# Patient Record
Sex: Female | Born: 1945 | Race: Black or African American | Hispanic: No | Marital: Married | Smoking: Never smoker
Health system: Southern US, Community
[De-identification: ages and names within clinical notes are randomized; demographics above are authoritative.]

## PROBLEM LIST (undated history)

## (undated) DIAGNOSIS — E119 Type 2 diabetes mellitus without complications: Secondary | ICD-10-CM

## (undated) DIAGNOSIS — E785 Hyperlipidemia, unspecified: Secondary | ICD-10-CM

## (undated) DIAGNOSIS — N189 Chronic kidney disease, unspecified: Secondary | ICD-10-CM

## (undated) DIAGNOSIS — I10 Essential (primary) hypertension: Secondary | ICD-10-CM

## (undated) DIAGNOSIS — I214 Non-ST elevation (NSTEMI) myocardial infarction: Secondary | ICD-10-CM

## (undated) HISTORY — DX: Chronic kidney disease, unspecified: N18.9

---

## 2014-06-07 ENCOUNTER — Encounter: Payer: Self-pay | Admitting: Cardiology

## 2014-06-07 ENCOUNTER — Inpatient Hospital Stay (HOSPITAL_COMMUNITY)
Admission: EM | Admit: 2014-06-07 | Discharge: 2014-06-13 | DRG: 247 | Disposition: A | Discharge status: Home / Self Care | Payer: Medicare Other | Source: Other Acute Inpatient Hospital | Attending: Internal Medicine | Admitting: Internal Medicine

## 2014-06-07 ENCOUNTER — Encounter (HOSPITAL_COMMUNITY): Payer: Self-pay | Admitting: Internal Medicine

## 2014-06-07 DIAGNOSIS — I059 Rheumatic mitral valve disease, unspecified: Secondary | ICD-10-CM | POA: Diagnosis present

## 2014-06-07 DIAGNOSIS — N179 Acute kidney failure, unspecified: Secondary | ICD-10-CM | POA: Diagnosis present

## 2014-06-07 DIAGNOSIS — R7989 Other specified abnormal findings of blood chemistry: Secondary | ICD-10-CM

## 2014-06-07 DIAGNOSIS — E785 Hyperlipidemia, unspecified: Secondary | ICD-10-CM | POA: Diagnosis present

## 2014-06-07 DIAGNOSIS — Z833 Family history of diabetes mellitus: Secondary | ICD-10-CM | POA: Diagnosis not present

## 2014-06-07 DIAGNOSIS — Z8249 Family history of ischemic heart disease and other diseases of the circulatory system: Secondary | ICD-10-CM

## 2014-06-07 DIAGNOSIS — Z6831 Body mass index (BMI) 31.0-31.9, adult: Secondary | ICD-10-CM | POA: Diagnosis not present

## 2014-06-07 DIAGNOSIS — I214 Non-ST elevation (NSTEMI) myocardial infarction: Principal | ICD-10-CM

## 2014-06-07 DIAGNOSIS — E1129 Type 2 diabetes mellitus with other diabetic kidney complication: Secondary | ICD-10-CM

## 2014-06-07 DIAGNOSIS — N183 Chronic kidney disease, stage 3 unspecified: Secondary | ICD-10-CM | POA: Diagnosis present

## 2014-06-07 DIAGNOSIS — IMO0001 Reserved for inherently not codable concepts without codable children: Secondary | ICD-10-CM | POA: Diagnosis present

## 2014-06-07 DIAGNOSIS — E1165 Type 2 diabetes mellitus with hyperglycemia: Secondary | ICD-10-CM

## 2014-06-07 DIAGNOSIS — R0789 Other chest pain: Secondary | ICD-10-CM | POA: Diagnosis present

## 2014-06-07 DIAGNOSIS — I2510 Atherosclerotic heart disease of native coronary artery without angina pectoris: Secondary | ICD-10-CM | POA: Diagnosis present

## 2014-06-07 DIAGNOSIS — Z955 Presence of coronary angioplasty implant and graft: Secondary | ICD-10-CM

## 2014-06-07 DIAGNOSIS — K219 Gastro-esophageal reflux disease without esophagitis: Secondary | ICD-10-CM | POA: Diagnosis present

## 2014-06-07 DIAGNOSIS — N189 Chronic kidney disease, unspecified: Secondary | ICD-10-CM

## 2014-06-07 DIAGNOSIS — R079 Chest pain, unspecified: Secondary | ICD-10-CM

## 2014-06-07 DIAGNOSIS — I129 Hypertensive chronic kidney disease with stage 1 through stage 4 chronic kidney disease, or unspecified chronic kidney disease: Secondary | ICD-10-CM | POA: Diagnosis present

## 2014-06-07 DIAGNOSIS — R778 Other specified abnormalities of plasma proteins: Secondary | ICD-10-CM

## 2014-06-07 DIAGNOSIS — T50995A Adverse effect of other drugs, medicaments and biological substances, initial encounter: Secondary | ICD-10-CM | POA: Diagnosis not present

## 2014-06-07 DIAGNOSIS — D638 Anemia in other chronic diseases classified elsewhere: Secondary | ICD-10-CM | POA: Diagnosis present

## 2014-06-07 DIAGNOSIS — Z79899 Other long term (current) drug therapy: Secondary | ICD-10-CM

## 2014-06-07 DIAGNOSIS — E669 Obesity, unspecified: Secondary | ICD-10-CM | POA: Diagnosis present

## 2014-06-07 DIAGNOSIS — IMO0002 Reserved for concepts with insufficient information to code with codable children: Secondary | ICD-10-CM

## 2014-06-07 DIAGNOSIS — I10 Essential (primary) hypertension: Secondary | ICD-10-CM | POA: Diagnosis present

## 2014-06-07 HISTORY — DX: Type 2 diabetes mellitus without complications: E11.9

## 2014-06-07 HISTORY — DX: Non-ST elevation (NSTEMI) myocardial infarction: I21.4

## 2014-06-07 HISTORY — DX: Essential (primary) hypertension: I10

## 2014-06-07 HISTORY — DX: Hyperlipidemia, unspecified: E78.5

## 2014-06-07 LAB — GLUCOSE, CAPILLARY: GLUCOSE-CAPILLARY: 184 mg/dL — AB (ref 70–99)

## 2014-06-07 MED ORDER — INSULIN DETEMIR 100 UNIT/ML ~~LOC~~ SOLN
10.0000 [IU] | Freq: Every day | SUBCUTANEOUS | Status: DC
  Administered 2014-06-08: 10 [IU] via SUBCUTANEOUS
  Filled 2014-06-07: qty 0.1

## 2014-06-07 MED ORDER — SODIUM CHLORIDE 0.9 % IV SOLN
INTRAVENOUS | Status: DC
  Administered 2014-06-08 (×2): via INTRAVENOUS

## 2014-06-07 MED ORDER — ASPIRIN EC 325 MG PO TBEC
325.0000 mg | DELAYED_RELEASE_TABLET | Freq: Every day | ORAL | Status: DC
  Administered 2014-06-08 – 2014-06-10 (×3): 325 mg via ORAL
  Filled 2014-06-07 (×5): qty 1

## 2014-06-07 MED ORDER — PANTOPRAZOLE SODIUM 40 MG PO TBEC
40.0000 mg | DELAYED_RELEASE_TABLET | Freq: Every day | ORAL | Status: DC
  Administered 2014-06-08 – 2014-06-13 (×6): 40 mg via ORAL
  Filled 2014-06-07 (×6): qty 1

## 2014-06-07 MED ORDER — INSULIN ASPART 100 UNIT/ML ~~LOC~~ SOLN: 0.0000 [IU] | Freq: Three times a day (TID) | SUBCUTANEOUS | Status: DC

## 2014-06-07 MED ORDER — MORPHINE SULFATE 2 MG/ML IJ SOLN: 1.0000 mg | INTRAMUSCULAR | Status: DC | PRN

## 2014-06-07 MED ORDER — ONDANSETRON HCL 4 MG/2ML IJ SOLN: 4.0000 mg | Freq: Four times a day (QID) | INTRAMUSCULAR | Status: DC | PRN

## 2014-06-07 MED ORDER — OXYCODONE HCL 5 MG PO TABS
5.0000 mg | ORAL_TABLET | ORAL | Status: DC | PRN
  Administered 2014-06-11: 18:00:00 5 mg via ORAL
  Filled 2014-06-07: qty 1

## 2014-06-07 MED ORDER — HEPARIN SODIUM (PORCINE) 5000 UNIT/ML IJ SOLN
5000.0000 [IU] | Freq: Three times a day (TID) | INTRAMUSCULAR | Status: DC
  Administered 2014-06-08 – 2014-06-09 (×5): 5000 [IU] via SUBCUTANEOUS
  Filled 2014-06-07 (×9): qty 1

## 2014-06-07 MED ORDER — ONDANSETRON HCL 4 MG PO TABS: 4.0000 mg | ORAL_TABLET | Freq: Four times a day (QID) | ORAL | Status: DC | PRN

## 2014-06-07 MED ORDER — ASPIRIN EC 325 MG PO TBEC: 325.0000 mg | DELAYED_RELEASE_TABLET | Freq: Every day | ORAL | Status: DC

## 2014-06-07 MED ORDER — SODIUM CHLORIDE 0.9 % IJ SOLN
3.0000 mL | Freq: Two times a day (BID) | INTRAMUSCULAR | Status: DC
  Administered 2014-06-08 – 2014-06-13 (×5): 3 mL via INTRAVENOUS

## 2014-06-07 MED ORDER — ATORVASTATIN CALCIUM 40 MG PO TABS
40.0000 mg | ORAL_TABLET | Freq: Every day | ORAL | Status: DC
  Administered 2014-06-08 – 2014-06-09 (×2): 40 mg via ORAL
  Filled 2014-06-07 (×3): qty 1

## 2014-06-07 MED ORDER — ACETAMINOPHEN 325 MG PO TABS
650.0000 mg | ORAL_TABLET | Freq: Four times a day (QID) | ORAL | Status: DC | PRN
  Administered 2014-06-09: 650 mg via ORAL
  Filled 2014-06-07: qty 2

## 2014-06-07 MED ORDER — ACETAMINOPHEN 650 MG RE SUPP: 650.0000 mg | Freq: Four times a day (QID) | RECTAL | Status: DC | PRN

## 2014-06-07 MED ORDER — CARVEDILOL 12.5 MG PO TABS
12.5000 mg | ORAL_TABLET | Freq: Two times a day (BID) | ORAL | Status: DC
  Administered 2014-06-08 (×2): 12.5 mg via ORAL
  Filled 2014-06-07 (×5): qty 1

## 2014-06-07 NOTE — H&P (Signed)
[facility] History and Physical  Miche Loughridge ZOX:[id number] DOB: June 12, 1946 DOA: 06/07/2014  Referring physician: Dr. Francesca Oman PCP: No primary provider on file.   Chief Complaint: hyperglycemia, chest pressure, weakness/fatigue  HPI: Janice Love is a 68 y.o. female with pmh significant for DM type 2 diagnosed approx 3 months or so ago (on metformin), HTN, HLD and GERD; came from [facility] hospital due to 3-4 days of generalized weakness, polyuria and chest pressure; patient had mild elevated troponin acute renal failure and hyperglycemia (600 range). Patient reports chest pressure occurs mainly with activity/exertion, radiates to the base of her neck and last approx 20-30 minutes. Patient is currently CP free on transfer and the moment of examination. She denies palpitations, SOB or diaphoresis with pain. No EKG abnormalities appreciated at [facility]; patient transferred due to lack of cardiology coverage over the weekend. Patient denies nausea, vomiting, dysuria, diarrhea, fever, chills, cough, orthopnea, leg swelling or any other complaints.   Review of Systems:  Negative except as mentioned on HPI.  Past Medical History  Diagnosis Date  . Hypertension   .  Diabetes mellitus without complication GERD HLD    History reviewed. No pertinent past surgical history.  Social History:  reports that she has never smoked. She does not have any smokeless tobacco history on file. Her alcohol and drug histories are not on file.  Not on File  Family History: positive for HTN and cholesterol; otherwise non-contributory according to patient.  Prior to Admission medications   Not on File   Physical Exam: Filed Vitals:   06/07/14 2230  BP: 159/58  Pulse: 84  Temp: 98.3 F (36.8 C)  TempSrc: Oral  Resp: 18  Height: 5\' 3"  (1.6 m)  Weight: 72.802 kg (160 lb 8 oz)  SpO2: 100%    Wt Readings from Last 3 Encounters:  06/07/14 72.802 kg (160 lb 8 oz)    General:  Appears  calm and comfortable; afebrile. No distress and currently denying CP Eyes: PERRL, normal lids, irises & conjunctiva, no icterus, no nystagmus  ENT: grossly normal hearing, lips & tongue, no thrush, no erythema, no exudates; no drainage out of ears or nostrils Neck: no LAD, masses or thyromegaly, no JVD Cardiovascular: RRR, positive SEM, no rubs or gallops; no LE edema Telemetry: SR, no arrhythmias  Respiratory: CTA bilaterally, no w/r/r. Normal respiratory effort. Abdomen: soft, nt, nd; positive BS Skin: no rash, petechiae or induration seen on limited exam Musculoskeletal: grossly normal tone BUE/BLE; no cyanosis or clubbing appreciated on exam Psychiatric: grossly normal mood and affect, speech fluent and appropriate Neurologic: grossly non-focal.          Labs on Admission:  Basic Metabolic Panel: No results found for this basename: NA, K, CL, CO2, GLUCOSE, BUN, CREATININE, CALCIUM, MG, PHOS,  in the last 168 hours Liver Function Tests: No results found for this basename: AST, ALT, ALKPHOS, BILITOT, PROT, ALBUMIN,  in the last 168 hours No results found for this basename: LIPASE, AMYLASE,  in the last 168 hours No results found for this basename: AMMONIA,  in the last 168 hours CBC: No results found for this basename: WBC, NEUTROABS, HGB, HCT, MCV, PLT,  in the last 168 hours Cardiac Enzymes: No results found for this basename: CKTOTAL, CKMB, CKMBINDEX, TROPONINI,  in the last 168 hours  BNP (last 3 results) No results found for this basename: PROBNP,  in the last 8760 hours CBG:  Recent Labs Lab 06/07/14 2304  GLUCAP 184*    Radiological Exams  on Admission: No results found.  EKG:    Assessment/Plan 1-Chest pain: typical angina pain, heart score 4-5. -will admit to telemetry -cycle EKG, troponin and check 2-D echo -will start patient on ASA, coreg BID and lipitor -PRN morphine for pain and O2 supplementation as needed -cardiology called as patient will need at the  very least stress test for stratification; will follow recommendations.  2-Benign essential HTN: will use coreg BID and low sodium diet -will wait for pharmacy to finalize medication reconciliation to review and resume home meds as well  3-Acute renal failure: unclear if acute on chronic (presumably has CKD at least stage 1 or 2, given hx of HTN and DM type 2). Presentation this time due to over diuresed with hyperglycemia state. -will provide fluid resuscitation -check UA/cx -follow BMET function -base on clinical response will need renal US  4-Type II or unspecified type diabetes mellitus without mention of complication, uncontrolled:  -will check A1C  -started on levemir and SSI -holding home metformin due to renal failure and possible cardiac cath  5-Elevated troponin: midly elevation [facility] (0.11); patient currently CP free and no acute EKG abnormalities.  6-HLD (hyperlipidemia): will start patient on lipitor and check lipid profile  7-GERD: on PPI  Cardiology (they will see patient in am)  Code Status: Full DVT Prophylaxis:heparin Family Communication: no family at bedisde Disposition Plan: inpatient, telemetry; LOS > 2 midnights  Time spent: 50 minutes  Vassie LollMadera, Janice Love [facility] Pager [phone]  **Disclaimer: This note may have been dictated with voice recognition software. Similar sounding words can inadvertently be transcribed and this note may contain transcription errors which may not have been corrected upon publication of note.**

## 2014-06-07 NOTE — Progress Notes (Signed)
Patient Transferred [facility] with positive troponins, no aspirin or heparin given prior to arrival. Dr.Madera made aware, aspirin given, will continue to monitor patient.

## 2014-06-08 ENCOUNTER — Inpatient Hospital Stay (HOSPITAL_COMMUNITY): Payer: Medicare Other

## 2014-06-08 ENCOUNTER — Encounter (HOSPITAL_COMMUNITY): Payer: Self-pay | Admitting: Cardiology

## 2014-06-08 DIAGNOSIS — I059 Rheumatic mitral valve disease, unspecified: Secondary | ICD-10-CM

## 2014-06-08 LAB — PHOSPHORUS: Phosphorus: 3.5 mg/dL (ref 2.3–4.6)

## 2014-06-08 LAB — LIPID PANEL
Cholesterol: 228 mg/dL — ABNORMAL HIGH (ref 0–200)
HDL: 39 mg/dL — ABNORMAL LOW (ref >39)
LDL CALC: 147 mg/dL — AB (ref 0–99)
TRIGLYCERIDES: 211 mg/dL — AB (ref <150)
Total CHOL/HDL Ratio: 5.8 RATIO
VLDL: 42 mg/dL — ABNORMAL HIGH (ref 0–40)

## 2014-06-08 LAB — COMPREHENSIVE METABOLIC PANEL
ALBUMIN: 3.2 g/dL — AB (ref 3.5–5.2)
ALT: 14 U/L (ref 0–35)
AST: 15 U/L (ref 0–37)
Alkaline Phosphatase: 88 U/L (ref 39–117)
Anion gap: 14 (ref 5–15)
BILIRUBIN TOTAL: 0.3 mg/dL (ref 0.3–1.2)
BUN: 42 mg/dL — ABNORMAL HIGH (ref 6–23)
CHLORIDE: 100 meq/L (ref 96–112)
CO2: 23 mEq/L (ref 19–32)
Calcium: 9 mg/dL (ref 8.4–10.5)
Creatinine, Ser: 1.53 mg/dL — ABNORMAL HIGH (ref 0.50–1.10)
GFR calc Af Amer: 39 mL/min — ABNORMAL LOW (ref >90)
GFR calc non Af Amer: 34 mL/min — ABNORMAL LOW (ref >90)
Glucose, Bld: 369 mg/dL — ABNORMAL HIGH (ref 70–99)
Potassium: 4.6 mEq/L (ref 3.7–5.3)
Sodium: 137 mEq/L (ref 137–147)
Total Protein: 7.3 g/dL (ref 6.0–8.3)

## 2014-06-08 LAB — BASIC METABOLIC PANEL
ANION GAP: 12 (ref 5–15)
BUN: 39 mg/dL — ABNORMAL HIGH (ref 6–23)
CHLORIDE: 102 meq/L (ref 96–112)
CO2: 24 meq/L (ref 19–32)
Calcium: 9 mg/dL (ref 8.4–10.5)
Creatinine, Ser: 1.46 mg/dL — ABNORMAL HIGH (ref 0.50–1.10)
GFR calc Af Amer: 41 mL/min — ABNORMAL LOW (ref >90)
GFR calc non Af Amer: 36 mL/min — ABNORMAL LOW (ref >90)
Glucose, Bld: 294 mg/dL — ABNORMAL HIGH (ref 70–99)
Potassium: 4.4 mEq/L (ref 3.7–5.3)
Sodium: 138 mEq/L (ref 137–147)

## 2014-06-08 LAB — CBC WITH DIFFERENTIAL/PLATELET
Basophils Absolute: 0 10*3/uL (ref 0.0–0.1)
Basophils Relative: 0 % (ref 0–1)
Eosinophils Absolute: 0.1 10*3/uL (ref 0.0–0.7)
Eosinophils Relative: 1 % (ref 0–5)
HCT: 29.6 % — ABNORMAL LOW (ref 36.0–46.0)
HEMOGLOBIN: 10.1 g/dL — AB (ref 12.0–15.0)
LYMPHS ABS: 2.4 10*3/uL (ref 0.7–4.0)
LYMPHS PCT: 38 % (ref 12–46)
MCH: 28.8 pg (ref 26.0–34.0)
MCHC: 34.1 g/dL (ref 30.0–36.0)
MCV: 84.3 fL (ref 78.0–100.0)
MONOS PCT: 7 % (ref 3–12)
Monocytes Absolute: 0.4 10*3/uL (ref 0.1–1.0)
NEUTROS ABS: 3.5 10*3/uL (ref 1.7–7.7)
NEUTROS PCT: 54 % (ref 43–77)
Platelets: 210 10*3/uL (ref 150–400)
RBC: 3.51 MIL/uL — AB (ref 3.87–5.11)
RDW: 12.7 % (ref 11.5–15.5)
WBC: 6.4 10*3/uL (ref 4.0–10.5)

## 2014-06-08 LAB — HEMOGLOBIN A1C
Hgb A1c MFr Bld: 15.2 % — ABNORMAL HIGH (ref <5.7)
Mean Plasma Glucose: 390 mg/dL — ABNORMAL HIGH (ref <117)

## 2014-06-08 LAB — MAGNESIUM: MAGNESIUM: 2.3 mg/dL (ref 1.5–2.5)

## 2014-06-08 LAB — TROPONIN I
TROPONIN I: 0.47 ng/mL — AB (ref <0.30)
Troponin I: 0.49 ng/mL (ref <0.30)
Troponin I: 0.34 ng/mL (ref <0.30)

## 2014-06-08 LAB — GLUCOSE, CAPILLARY
Glucose-Capillary: 303 mg/dL — ABNORMAL HIGH (ref 70–99)
Glucose-Capillary: 245 mg/dL — ABNORMAL HIGH (ref 70–99)
Glucose-Capillary: 166 mg/dL — ABNORMAL HIGH (ref 70–99)
Glucose-Capillary: 447 mg/dL — ABNORMAL HIGH (ref 70–99)

## 2014-06-08 LAB — TSH: TSH: 1.17 u[IU]/mL (ref 0.350–4.500)

## 2014-06-08 IMAGING — CR DG CHEST 1V PORT
1 series · 1 of 1 positions shown · non-contrast
Comparison: None.

CLINICAL DATA: Chest pain. Current history of hypertension,
diabetes and hyperlipidemia.

EXAM:
PORTABLE CHEST - 1 VIEW

[AP]
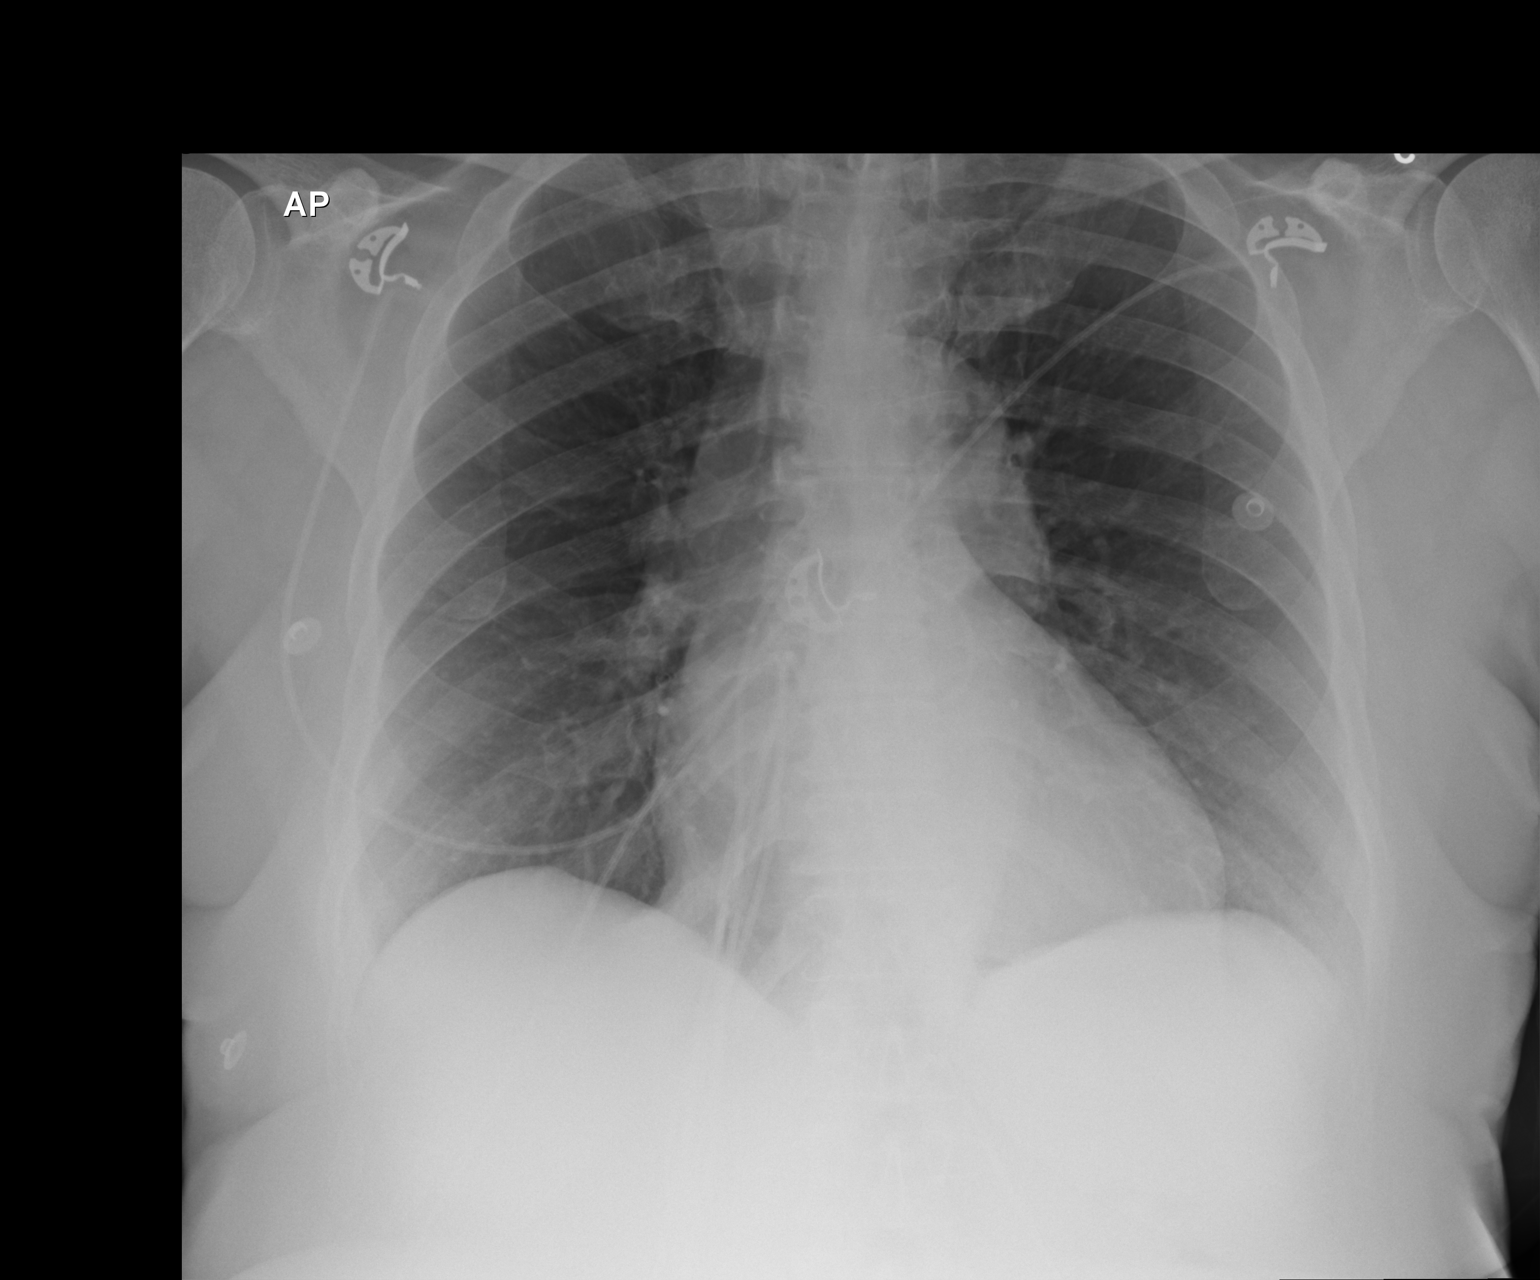

[1 of 1 positions shown; findings below may reference images not displayed]

FINDINGS: Cardiac silhouette upper normal in size to perhaps minimally
enlarged for the AP portable technique. Thoracic aorta mildly
tortuous. Hilar and mediastinal contours otherwise unremarkable.
Lungs clear. Bronchovascular markings normal. Pulmonary vascularity
normal. No visible pleural effusions. No pneumothorax.
IMPRESSION: Borderline to mild cardiomegaly.  No acute cardiopulmonary disease.

## 2014-06-08 MED ORDER — INSULIN DETEMIR 100 UNIT/ML ~~LOC~~ SOLN
15.0000 [IU] | Freq: Two times a day (BID) | SUBCUTANEOUS | Status: DC
  Filled 2014-06-08: qty 0.15

## 2014-06-08 MED ORDER — INSULIN ASPART 100 UNIT/ML ~~LOC~~ SOLN
0.0000 [IU] | Freq: Three times a day (TID) | SUBCUTANEOUS | Status: DC
  Administered 2014-06-08: 2 [IU] via SUBCUTANEOUS

## 2014-06-08 MED ORDER — INSULIN ASPART 100 UNIT/ML ~~LOC~~ SOLN
0.0000 [IU] | Freq: Every day | SUBCUTANEOUS | Status: DC
  Administered 2014-06-10: 23:00:00 2 [IU] via SUBCUTANEOUS

## 2014-06-08 MED ORDER — INSULIN ASPART 100 UNIT/ML ~~LOC~~ SOLN
4.0000 [IU] | Freq: Three times a day (TID) | SUBCUTANEOUS | Status: DC
Start: 2014-06-09 — End: 2014-06-13
  Administered 2014-06-09 – 2014-06-13 (×7): 4 [IU] via SUBCUTANEOUS

## 2014-06-08 MED ORDER — INSULIN ASPART 100 UNIT/ML ~~LOC~~ SOLN
0.0000 [IU] | Freq: Three times a day (TID) | SUBCUTANEOUS | Status: DC
  Administered 2014-06-08: 5 [IU] via SUBCUTANEOUS

## 2014-06-08 MED ORDER — INSULIN DETEMIR 100 UNIT/ML ~~LOC~~ SOLN
10.0000 [IU] | Freq: Two times a day (BID) | SUBCUTANEOUS | Status: DC
  Administered 2014-06-08: 10 [IU] via SUBCUTANEOUS
  Filled 2014-06-08 (×2): qty 0.1

## 2014-06-08 MED ORDER — INSULIN ASPART 100 UNIT/ML ~~LOC~~ SOLN
0.0000 [IU] | Freq: Three times a day (TID) | SUBCUTANEOUS | Status: DC
  Administered 2014-06-09: 11 [IU] via SUBCUTANEOUS
  Administered 2014-06-09: 8 [IU] via SUBCUTANEOUS
  Administered 2014-06-10: 3 [IU] via SUBCUTANEOUS

## 2014-06-08 MED ORDER — GABAPENTIN 100 MG PO CAPS
100.0000 mg | ORAL_CAPSULE | Freq: Three times a day (TID) | ORAL | Status: DC
  Administered 2014-06-08 – 2014-06-13 (×14): 100 mg via ORAL
  Filled 2014-06-08 (×18): qty 1

## 2014-06-08 MED ORDER — ISOSORBIDE MONONITRATE ER 30 MG PO TB24
30.0000 mg | ORAL_TABLET | Freq: Every day | ORAL | Status: DC
  Administered 2014-06-08: 30 mg via ORAL
  Filled 2014-06-08 (×2): qty 1

## 2014-06-08 MED ORDER — INSULIN ASPART 100 UNIT/ML ~~LOC~~ SOLN: 0.0000 [IU] | Freq: Every day | SUBCUTANEOUS | Status: DC

## 2014-06-08 MED ORDER — INSULIN DETEMIR 100 UNIT/ML ~~LOC~~ SOLN
10.0000 [IU] | Freq: Two times a day (BID) | SUBCUTANEOUS | Status: DC
  Administered 2014-06-08 – 2014-06-09 (×2): 10 [IU] via SUBCUTANEOUS
  Filled 2014-06-08 (×3): qty 0.1

## 2014-06-08 MED ORDER — INSULIN ASPART 100 UNIT/ML ~~LOC~~ SOLN
15.0000 [IU] | Freq: Once | SUBCUTANEOUS | Status: AC
  Administered 2014-06-08: 15 [IU] via SUBCUTANEOUS

## 2014-06-08 NOTE — Consult Note (Addendum)
CARDIOLOGY CONSULT NOTE     Patient ID: Janice Love MRN: [id number] DOB/AGE: 1945-12-15 68 y.o.  Admit date: 06/07/2014 Referring Physician Archie Balboa MD Primary Physician No primary provider on file. Primary Cardiologist N/A Reason for Consultation Elevated Troponin  HPI: 68 yo BF seen at the Request of [facility] for evaluation of chest pain and elevated troponin. She is a pleasant patient without history of heart disease. She states she has not felt well over the past 3 months. She reports she was hosptialized 3 months ago and diagnosed with new diabetes mellitus. Since then she still has not felt well. Yesterday she presented to [facility] in [place] with complaints of weakness, leg numbness, Chest pressure, and polyuria. She states she has had chest pressure over the past 3 days radiating into her neck. It is worse with exertion but also occurs at rest. On admission her blood sugar was elevated to 600. Creatinine was elevated to 1.97. Troponin was 0.11. Since arrival here troponin peaked at  0.49. She is feeling much better today and is currently without chest pain. Reports history of HTN and hyperlipidemia. Positive history of CAD in father and brother. No tobacco use.  Past Medical History  Diagnosis Date  . Hypertension   . Diabetes mellitus without complication     History reviewed. No pertinent family history.  History   Social History  . Marital Status: Married    Spouse Name: N/A    Number of Children: N/A  . Years of Education: N/A   Occupational History  . Not on file.   Social History Main Topics  . Smoking status: Never Smoker   . Smokeless tobacco: Not on file  . Alcohol Use: Not on file  . Drug Use: Not on file  . Sexual Activity: Not on file   Other Topics Concern  . Not on file   Social History Narrative  . No narrative on file    History reviewed. No pertinent past surgical history.   Prescriptions prior to admission  Medication Sig  Dispense Refill  . metFORMIN (GLUCOPHAGE) 500 MG tablet Take 500 mg by mouth 2 (two) times daily with a meal.      . PRESCRIPTION MEDICATION Take 1 tablet by mouth daily. Blood pressure      . PRESCRIPTION MEDICATION Take 1 tablet by mouth daily. Blood pressure      . PRESCRIPTION MEDICATION Take 1 tablet by mouth daily. Blood pressure      . PRESCRIPTION MEDICATION Take 1 tablet by mouth daily. cholesterol      . PRESCRIPTION MEDICATION Take 1 tablet by mouth daily. Antibiotic        Meds ordered this encounter  Medications  . heparin injection 5,000 Units    Sig:   . sodium chloride 0.9 % injection 3 mL    Sig:   . 0.9 %  sodium chloride infusion    Sig:   . acetaminophen (TYLENOL) tablet 650 mg    Sig:    Or  . acetaminophen (TYLENOL) suppository 650 mg    Sig:   . oxyCODONE (Oxy IR/ROXICODONE) immediate release tablet 5 mg    Sig:   . morphine 2 MG/ML injection 1 mg    Sig:   . ondansetron (ZOFRAN) tablet 4 mg    Sig:    Or  . ondansetron (ZOFRAN) injection 4 mg    Sig:   . DISCONTD: aspirin EC tablet 325 mg    Sig:   . DISCONTD: insulin  detemir (LEVEMIR) injection 10 Units    Sig:   . DISCONTD: insulin aspart (novoLOG) injection 0-9 Units    Sig:     Order Specific Question:  Correction coverage:    Answer:  Sensitive (thin, NPO, renal)    Order Specific Question:  CBG < 70:    Answer:  implement hypoglycemia protocol    Order Specific Question:  CBG 70 - 120:    Answer:  0 units    Order Specific Question:  CBG 121 - 150:    Answer:  1 unit    Order Specific Question:  CBG 151 - 200:    Answer:  2 units    Order Specific Question:  CBG 201 - 250:    Answer:  3 units    Order Specific Question:  CBG 251 - 300:    Answer:  5 units    Order Specific Question:  CBG 301 - 350:    Answer:  7 units    Order Specific Question:  CBG 351 - 400    Answer:  9 units    Order Specific Question:  CBG > 400    Answer:  call MD and obtain STAT lab verification  .  carvedilol (COREG) tablet 12.5 mg    Sig:   . atorvastatin (LIPITOR) tablet 40 mg    Sig:   . pantoprazole (PROTONIX) EC tablet 40 mg    Sig:   . aspirin EC tablet 325 mg    Sig:   . metFORMIN (GLUCOPHAGE) 500 MG tablet    Sig: Take 500 mg by mouth 2 (two) times daily with a meal.  . PRESCRIPTION MEDICATION    Sig: Take 1 tablet by mouth daily. Blood pressure  . PRESCRIPTION MEDICATION    Sig: Take 1 tablet by mouth daily. Blood pressure  . PRESCRIPTION MEDICATION    Sig: Take 1 tablet by mouth daily. Blood pressure  . PRESCRIPTION MEDICATION    Sig: Take 1 tablet by mouth daily. cholesterol  . PRESCRIPTION MEDICATION    Sig: Take 1 tablet by mouth daily. Antibiotic  . insulin detemir (LEVEMIR) injection 10 Units    Sig:   . insulin aspart (novoLOG) injection 0-15 Units    Sig:     Order Specific Question:  Correction coverage:    Answer:  Moderate (average weight, post-op)    Order Specific Question:  CBG < 70:    Answer:  implement hypoglycemia protocol    Order Specific Question:  CBG 70 - 120:    Answer:  0 units    Order Specific Question:  CBG 121 - 150:    Answer:  2 units    Order Specific Question:  CBG 151 - 200:    Answer:  3 units    Order Specific Question:  CBG 201 - 250:    Answer:  5 units    Order Specific Question:  CBG 251 - 300:    Answer:  8 units    Order Specific Question:  CBG 301 - 350:    Answer:  11 units    Order Specific Question:  CBG 351 - 400:    Answer:  15 units    Order Specific Question:  CBG > 400    Answer:  call MD and obtain STAT lab verification  . gabapentin (NEURONTIN) capsule 100 mg    Sig:       ROS: As noted in HPI. All other systems are reviewed and are negative unless  otherwise mentioned.   Physical Exam: Blood pressure 174/70, pulse 90, temperature 97.7 F (36.5 C), temperature source Oral, resp. rate 18, height 5\' 3"  (1.6 m), weight 160 lb 7.9 oz (72.8 kg), SpO2 98.00%. Current Weight  06/08/14 160 lb 7.9 oz  (72.8 kg)    GENERAL:  Well appearing, obese BF in NAD HEENT:  PERRL, EOMI, sclera are clear. Oropharynx is clear. NECK:  No jugular venous distention, carotid upstroke brisk and symmetric, no bruits, no thyromegaly or adenopathy LUNGS:  Clear to auscultation bilaterally CHEST:  Unremarkable HEART:  RRR,  PMI not displaced or sustained,S1 and S2 within normal limits, no S3, no S4: no clicks, no rubs. Grade 1-2/6 systolic murmur at apex. ABD:  Soft, nontender. BS +, no masses or bruits. No hepatomegaly, no splenomegaly, obese EXT:  2 + pulses throughout, no edema, no cyanosis no clubbing SKIN:  Warm and dry.  No rashes NEURO:  Alert and oriented x 3. Cranial nerves II through XII intact. PSYCH:  Cognitively intact    Labs:   Lab Results  Component Value Date   WBC 6.4 06/08/2014   HGB 10.1* 06/08/2014   HCT 29.6* 06/08/2014   MCV 84.3 06/08/2014   PLT 210 06/08/2014     Recent Labs Lab 06/08/14 0046 06/08/14 0501  NA 137 138  K 4.6 4.4  CL 100 102  CO2 23 24  BUN 42* 39*  CREATININE 1.53* 1.46*  CALCIUM 9.0 9.0  PROT 7.3  --   BILITOT 0.3  --   ALKPHOS 88  --   ALT 14  --   AST 15  --   GLUCOSE 369* 294*   Lab Results  Component Value Date   TROPONINI 0.49* 06/08/2014   TROPONINI 0.47* 06/08/2014    Lab Results  Component Value Date   CHOL 228* 06/08/2014   Lab Results  Component Value Date   HDL 39* 06/08/2014   Lab Results  Component Value Date   LDLCALC 147* 06/08/2014   Lab Results  Component Value Date   TRIG 211* 06/08/2014   Lab Results  Component Value Date   CHOLHDL 5.8 06/08/2014   No results found for this basename: LDLDIRECT    No results found for this basename: PROBNP   Lab Results  Component Value Date   TSH 1.170 06/08/2014   No results found for this basename: HGBA1C    Radiology: Dg Chest Port 1 View  06/08/2014   CLINICAL DATA:  Chest pain. Current history of hypertension, diabetes and hyperlipidemia.  EXAM: PORTABLE CHEST - 1  VIEW  COMPARISON:  None.  FINDINGS: Cardiac silhouette upper normal in size to perhaps minimally enlarged for the AP portable technique. Thoracic aorta mildly tortuous. Hilar and mediastinal contours otherwise unremarkable. Lungs clear. Bronchovascular markings normal. Pulmonary vascularity normal. No visible pleural effusions. No pneumothorax.  IMPRESSION: Borderline to mild cardiomegaly.  No acute cardiopulmonary disease.   Electronically Signed   By: Hulan Saas M.D.   On: 06/08/2014 08:16    EKG: NSR with Lateral T wave inversion.    ASSESSMENT AND PLAN:  1. NSTEMI with recent onset chest pain and positive troponins. Mild T wave inversion in lateral leads on  Ecg. Patient has risk factors of DM, HTN, dyslipidemia, and family history of CAD. She is relatively high risk and ordinarily I would recommend cardiac cath. Given her renal failure, however, I think it would be prudent to risk stratify her with a  Nuclear stress test. Will arrange [facility] today. If low risk I would recommend Medical therapy. If intermediate to high risk she may need cardiac cath but would need to wait until renal function improved. Continue ASA, statin, and beta blocker.  2. DM type 2 poorly controlled.  3. HTN  4. Dyslipidemia. Start statin.  5. Acute renal failure.  6. Apical murmur. Echo pending.  Signed: Rondle Lohse [place], MDFACC  06/08/2014, 8:59 AM

## 2014-06-08 NOTE — Progress Notes (Signed)
  Echocardiogram 2D Echocardiogram has been performed.  Janice Love FRANCES 06/08/2014, 4:44 PM

## 2014-06-08 NOTE — Discharge Instructions (Addendum)
Diabetes Mellitus and Food °It is important for you to manage your blood sugar (glucose) level. Your blood glucose level can be greatly affected by what you eat. Eating healthier foods in the appropriate amounts throughout the day at about the same time each day will help you control your blood glucose level. It can also help slow or prevent worsening of your diabetes mellitus. Healthy eating may even help you improve the level of your blood pressure and reach or maintain a healthy weight.  °HOW CAN FOOD AFFECT ME? °Carbohydrates °Carbohydrates affect your blood glucose level more than any other type of food. Your dietitian will help you determine how many carbohydrates to eat at each meal and teach you how to count carbohydrates. Counting carbohydrates is important to keep your blood glucose at a healthy level, especially if you are using insulin or taking certain medicines for diabetes mellitus. °Alcohol °Alcohol can cause sudden decreases in blood glucose (hypoglycemia), especially if you use insulin or take certain medicines for diabetes mellitus. Hypoglycemia can be a life-threatening condition. Symptoms of hypoglycemia (sleepiness, dizziness, and disorientation) are similar to symptoms of having too much alcohol.  °If your health care provider has given you approval to drink alcohol, do so in moderation and use the following guidelines: °· Women should not have more than one drink per day, and men should not have more than two drinks per day. One drink is equal to: °¨ 12 oz of beer. °¨ 5 oz of wine. °¨ 1½ oz of hard liquor. °· Do not drink on an empty stomach. °· Keep yourself hydrated. Have water, diet soda, or unsweetened iced tea. °· Regular soda, juice, and other mixers might contain a lot of carbohydrates and should be counted. °WHAT FOODS ARE NOT RECOMMENDED? °As you make food choices, it is important to remember that all foods are not the same. Some foods have fewer nutrients per serving than other  foods, even though they might have the same number of calories or carbohydrates. It is difficult to get your body what it needs when you eat foods with fewer nutrients. Examples of foods that you should avoid that are high in calories and carbohydrates but low in nutrients include: °· Trans fats (most processed foods list trans fats on the Nutrition Facts label). °· Regular soda. °· Juice. °· Candy. °· Sweets, such as cake, pie, doughnuts, and cookies. °· Fried foods. °WHAT FOODS CAN I EAT? °Have nutrient-rich foods, which will nourish your body and keep you healthy. The food you should eat also will depend on several factors, including: °· The calories you need. °· The medicines you take. °· Your weight. °· Your blood glucose level. °· Your blood pressure level. °· Your cholesterol level. °You also should eat a variety of foods, including: °· Protein, such as meat, poultry, fish, tofu, nuts, and seeds (lean animal proteins are best). °· Fruits. °· Vegetables. °· Dairy products, such as milk, cheese, and yogurt (low fat is best). °· Breads, grains, pasta, cereal, rice, and beans. °· Fats such as olive oil, trans fat-free margarine, canola oil, avocado, and olives. °DOES EVERYONE WITH DIABETES MELLITUS HAVE THE SAME MEAL PLAN? °Because every person with diabetes mellitus is different, there is not one meal plan that works for everyone. It is very important that you meet with a dietitian who will help you create a meal plan that is just right for you. °Document Released: 07/29/2005 Document Revised: 11/06/2013 Document Reviewed: 09/28/2013 °ExitCare® Patient Information ©2015 [employer]. This   information is not intended to replace advice given to you by your health care provider. Make sure you discuss any questions you have with your health care provider.   PLEASE REMEMBER TO BRING ALL OF YOUR MEDICATIONS TO EACH OF YOUR FOLLOW-UP OFFICE VISITS.  PLEASE ATTEND ALL SCHEDULED FOLLOW-UP APPOINTMENTS.    Activity: Increase activity slowly as tolerated. You may shower, but no soaking baths (or swimming) for 1 week. No driving for 2 days. No lifting over 5 lbs for 1 week. No sexual activity for 1 week.   You May Return to Work: in 1 week (if applicable)  Wound Care: You may wash cath site gently with soap and water. Keep cath site clean and dry. If you notice pain, swelling, bleeding or pus at your cath site, please call [phone].    Cardiac Cath Site Care Refer to this sheet in the next few weeks. These instructions provide you with information on caring for yourself after your procedure. Your caregiver may also give you more specific instructions. Your treatment has been planned according to current medical practices, but problems sometimes occur. Call your caregiver if you have any problems or questions after your procedure. HOME CARE INSTRUCTIONS  You may shower 24 hours after the procedure. Remove the bandage (dressing) and gently wash the site with plain soap and water. Gently pat the site dry.   Do not apply powder or lotion to the site.   Do not sit in a bathtub, swimming pool, or whirlpool for 5 to 7 days.   No bending, squatting, or lifting anything over 10 pounds (4.5 kg) as directed by your caregiver.   Inspect the site at least twice daily.   Do not drive home if you are discharged the same day of the procedure. Have someone else drive you.   You may drive 24 hours after the procedure unless otherwise instructed by your caregiver.  What to expect:  Any bruising will usually fade within 1 to 2 weeks.   Blood that collects in the tissue (hematoma) may be painful to the touch. It should usually decrease in size and tenderness within 1 to 2 weeks.  SEEK IMMEDIATE MEDICAL CARE IF:  You have unusual pain at the site or down the affected limb.   You have redness, warmth, swelling, or pain at the site.   You have drainage (other than a small amount of blood on the  dressing).   You have chills.   You have a fever or persistent symptoms for more than 72 hours.   You have a fever and your symptoms suddenly get worse.   Your leg becomes pale, cool, tingly, or numb.   You have heavy bleeding from the site. Hold pressure on the site.  Document Released: 12/04/2010 Document Revised: 10/21/2011 Document Reviewed: 12/04/2010 [facility] Patient Information 2012 [employer] [place].

## 2014-06-08 NOTE — Progress Notes (Signed)
PROGRESS NOTE    Janice Love ZOX:[id number] DOB: October 17, 1946 DOA: 06/07/2014 PCP: No primary provider on file.  HPI/Brief narrative 68 year old female patient with history of recently diagnosed type II DM, hypertension, hyperlipidemia and GERD, lifelong nonsmoker, family history of MI/heart disease in brother, transferred from [facility] hospital with complaints of chest pain. She was hospitalized approximately 3 months ago at [facility] and told to have diabetes and hypertension. She was discharged on oral medications. She was reassessed a week ago with her PCP who stated that her blood pressure was still not adequately controlled. While at work on 06/07/14, she started experiencing upper mid/left-sided chest pain, radiating to her neck, no true relationship to exertion. She left work and went home. She felt dizzy and nauseous. She also had polyuria and weakness. At the outside ED, her blood sugars are apparently in the 600 mg per DL range, acute renal failure-baseline creatinine unknown and mildly elevated troponin. She was transferred to [facility] due to lack of cardiology coverage over the weekend. Cardiology consulted.   Assessment/Plan:  1. NSTEMI: Peak troponin of 0.49. EKG with T wave inversion in lateral leads. Currently chest pain free. CAD risk factors-type II DM, HTN, HLD and family history of CAD. Cardiology consulted and given current renal insufficiency, hesitant to do cardiac cath which may make her renal failure worse. Thereby, nuclear stress test with Lexi scan Myoview planned for today and if it is intermediate to high risk then cardiac cath after renal functions improve. Continue aspirin, statin and beta blocker. Followup 2-D echo. 2. Uncontrolled type II DM: Hold metformin secondary to renal failure. Follow A1c. Started on Levemir and SSI. Monitor closely. 3. Uncontrolled hypertension: Continue Coreg. Unable to start ACE inhibitor/ARB secondary to renal failure and may  consider when renal failure improves. Monitor and adjust medications as needed. 4. Acute renal failure or chronic kidney disease: Baseline creatinine unknown. Continue IV fluid resuscitation and follow BMP. Creatinine slightly better. 5. Hyperlipidemia: Started on statins. 6. GERD: Continue PPI 7. Anemia: Follow CBCs   Code Status: Full Family Communication: None at bedside Disposition Plan: Home when medically stable   Consultants:  Cardiology  Procedures:  None  Antibiotics:  None   Subjective: Denies chest pain since arrival to [facility]. Denies any other complaints.  Objective: Filed Vitals:   06/08/14 0625 06/08/14 0820 06/08/14 1000 06/08/14 1417  BP: 155/96 174/70 135/60 146/50  Pulse:  90 61 72  Temp:    97.6 F (36.4 C)  TempSrc:    Oral  Resp:      Height:      Weight:      SpO2:    99%    Intake/Output Summary (Last 24 hours) at 06/08/14 1519 Last data filed at 06/08/14 1000  Gross per 24 hour  Intake    363 ml  Output    550 ml  Net   -187 ml   Filed Weights   06/07/14 2230 06/08/14 0457  Weight: 72.802 kg (160 lb 8 oz) 72.8 kg (160 lb 7.9 oz)     Exam:  General exam: Pleasant middle-aged female lying comfortably in bed. Respiratory system: Clear. No increased work of breathing. Cardiovascular system: S1 & S2 heard, RRR. No JVD, murmurs, gallops, clicks or pedal edema. Telemetry: Sinus rhythm. Gastrointestinal system: Abdomen is nondistended, soft and nontender. Normal bowel sounds heard. Central nervous system: Alert and oriented. No focal neurological deficits. Extremities: Symmetric 5 x 5 power.   Data Reviewed: Basic Metabolic Panel:  Recent Labs Lab 06/08/14 0046 06/08/14 0501  NA 137 138  K 4.6 4.4  CL 100 102  CO2 23 24  GLUCOSE 369* 294*  BUN 42* 39*  CREATININE 1.53* 1.46*  CALCIUM 9.0 9.0  MG 2.3  --   PHOS 3.5  --    Liver Function Tests:  Recent Labs Lab 06/08/14 0046  AST 15  ALT 14  ALKPHOS 88   BILITOT 0.3  PROT 7.3  ALBUMIN 3.2*   No results found for this basename: LIPASE, AMYLASE,  in the last 168 hours No results found for this basename: AMMONIA,  in the last 168 hours CBC:  Recent Labs Lab 06/08/14 0046  WBC 6.4  NEUTROABS 3.5  HGB 10.1*  HCT 29.6*  MCV 84.3  PLT 210   Cardiac Enzymes:  Recent Labs Lab 06/08/14 0046 06/08/14 0501 06/08/14 1130  TROPONINI 0.47* 0.49* 0.34*   BNP (last 3 results) No results found for this basename: PROBNP,  in the last 8760 hours CBG:  Recent Labs Lab 06/07/14 2304 06/08/14 0434 06/08/14 0750 06/08/14 1142  GLUCAP 184* 303* 245* 166*    No results found for this or any previous visit (from the past 240 hour(s)).    Additional labs: 1. Fasting lipids: Cholesterol 228, triglycerides 211, HDL 39, LDL 147 and VLDL 42 2. TSH: 1.170      Studies: Dg Chest Port 1 View  06/08/2014   CLINICAL DATA:  Chest pain. Current history of hypertension, diabetes and hyperlipidemia.  EXAM: PORTABLE CHEST - 1 VIEW  COMPARISON:  None.  FINDINGS: Cardiac silhouette upper normal in size to perhaps minimally enlarged for the AP portable technique. Thoracic aorta mildly tortuous. Hilar and mediastinal contours otherwise unremarkable. Lungs clear. Bronchovascular markings normal. Pulmonary vascularity normal. No visible pleural effusions. No pneumothorax.  IMPRESSION: Borderline to mild cardiomegaly.  No acute cardiopulmonary disease.   Electronically Signed   By: Hulan Saashomas  Lawrence M.D.   On: 06/08/2014 08:16        Scheduled Meds: . aspirin EC  325 mg Oral Daily  . atorvastatin  40 mg Oral q1800  . carvedilol  12.5 mg Oral BID WC  . gabapentin  100 mg Oral TID  . heparin  5,000 Units Subcutaneous 3 times per day  . insulin aspart  0-5 Units Subcutaneous QHS  . insulin aspart  0-9 Units Subcutaneous TID WC  . insulin detemir  10 Units Subcutaneous BID  . isosorbide mononitrate  30 mg Oral Daily  . pantoprazole  40 mg Oral Q1200   . sodium chloride  3 mL Intravenous Q12H   Continuous Infusions: . sodium chloride 100 mL/hr at 06/08/14 1255    Principal Problem:   Chest pain Active Problems:   Benign essential HTN   Acute renal failure   Type II or unspecified type diabetes mellitus without mention of complication, uncontrolled   Elevated troponin   HLD (hyperlipidemia)    Time spent: 30 minutes.    Marcellus ScottHONGALGI,Veta Dambrosia, MD, FACP, FHM. [facility] Pager [phone]  If 7PM-7AM, please contact night-coverage [email or web address] Password [email or web address] 06/08/2014, 3:19 PM    LOS: 1 day

## 2014-06-08 NOTE — Progress Notes (Signed)
Dr. Gwenlyn PerkingMadera made aware of patient Troponin 0.47, ordered to continue with current plan of care. Patient has no complaints of chest pain, will continue to monitor patient.

## 2014-06-08 NOTE — Progress Notes (Addendum)
CBG machine not uploading to Epic.  CBG Machine history reviewed with Newell CoralLaura Jacobs, RN.  Patient's CBG= 447 (Taken:  7/25 at 1646).  Dr. Waymon AmatoHongalgi notified, new order received to give 15 units novolog.

## 2014-06-09 ENCOUNTER — Inpatient Hospital Stay (HOSPITAL_COMMUNITY): Payer: Medicare Other

## 2014-06-09 DIAGNOSIS — IMO0001 Reserved for inherently not codable concepts without codable children: Secondary | ICD-10-CM

## 2014-06-09 DIAGNOSIS — E1165 Type 2 diabetes mellitus with hyperglycemia: Secondary | ICD-10-CM

## 2014-06-09 LAB — BASIC METABOLIC PANEL
Anion gap: 12 (ref 5–15)
BUN: 32 mg/dL — AB (ref 6–23)
CALCIUM: 8.6 mg/dL (ref 8.4–10.5)
CO2: 21 mEq/L (ref 19–32)
CREATININE: 1.44 mg/dL — AB (ref 0.50–1.10)
Chloride: 103 mEq/L (ref 96–112)
GFR calc Af Amer: 42 mL/min — ABNORMAL LOW (ref >90)
GFR, EST NON AFRICAN AMERICAN: 36 mL/min — AB (ref >90)
GLUCOSE: 185 mg/dL — AB (ref 70–99)
Potassium: 4 mEq/L (ref 3.7–5.3)
Sodium: 136 mEq/L — ABNORMAL LOW (ref 137–147)

## 2014-06-09 LAB — GLUCOSE, CAPILLARY
GLUCOSE-CAPILLARY: 136 mg/dL — AB (ref 70–99)
GLUCOSE-CAPILLARY: 209 mg/dL — AB (ref 70–99)
GLUCOSE-CAPILLARY: 341 mg/dL — AB (ref 70–99)
GLUCOSE-CAPILLARY: 143 mg/dL — AB (ref 70–99)
Glucose-Capillary: 278 mg/dL — ABNORMAL HIGH (ref 70–99)

## 2014-06-09 LAB — RETICULOCYTES
RBC.: 3.87 MIL/uL (ref 3.87–5.11)
RETIC COUNT ABSOLUTE: 77.4 10*3/uL (ref 19.0–186.0)
Retic Ct Pct: 2 % (ref 0.4–3.1)

## 2014-06-09 LAB — IRON AND TIBC
IRON: 102 ug/dL (ref 42–135)
Saturation Ratios: 36 % (ref 20–55)
TIBC: 283 ug/dL (ref 250–470)
UIBC: 181 ug/dL (ref 125–400)

## 2014-06-09 LAB — CBC
HEMATOCRIT: 25 % — AB (ref 36.0–46.0)
Hemoglobin: 8.6 g/dL — ABNORMAL LOW (ref 12.0–15.0)
MCH: 29.4 pg (ref 26.0–34.0)
MCHC: 34.4 g/dL (ref 30.0–36.0)
MCV: 85.3 fL (ref 78.0–100.0)
Platelets: 171 10*3/uL (ref 150–400)
RBC: 2.93 MIL/uL — ABNORMAL LOW (ref 3.87–5.11)
RDW: 12.8 % (ref 11.5–15.5)
WBC: 6.4 10*3/uL (ref 4.0–10.5)

## 2014-06-09 LAB — VITAMIN B12: VITAMIN B 12: 955 pg/mL — AB (ref 211–911)

## 2014-06-09 LAB — FERRITIN: Ferritin: 348 ng/mL — ABNORMAL HIGH (ref 10–291)

## 2014-06-09 LAB — FOLATE: Folate: >20 ng/mL

## 2014-06-09 MED ORDER — SODIUM CHLORIDE 0.9 % IJ SOLN: 3.0000 mL | INTRAMUSCULAR | Status: DC | PRN

## 2014-06-09 MED ORDER — ISOSORBIDE MONONITRATE ER 60 MG PO TB24
60.0000 mg | ORAL_TABLET | Freq: Every day | ORAL | Status: DC
  Administered 2014-06-10 – 2014-06-13 (×4): 60 mg via ORAL
  Filled 2014-06-09 (×5): qty 1

## 2014-06-09 MED ORDER — SODIUM CHLORIDE 0.9 % IV SOLN: 250.0000 mL | INTRAVENOUS | Status: DC | PRN

## 2014-06-09 MED ORDER — TECHNETIUM TC 99M SESTAMIBI - CARDIOLITE
30.0000 | Freq: Once | INTRAVENOUS | Status: AC | PRN
  Administered 2014-06-09: 12:00:00 30 via INTRAVENOUS

## 2014-06-09 MED ORDER — SODIUM CHLORIDE 0.9 % IV SOLN: 1.0000 mL/kg/h | INTRAVENOUS | Status: DC

## 2014-06-09 MED ORDER — INSULIN DETEMIR 100 UNIT/ML ~~LOC~~ SOLN
15.0000 [IU] | Freq: Two times a day (BID) | SUBCUTANEOUS | Status: DC
  Administered 2014-06-09: 15 [IU] via SUBCUTANEOUS
  Filled 2014-06-09 (×3): qty 0.15

## 2014-06-09 MED ORDER — HYDRALAZINE HCL 20 MG/ML IJ SOLN
10.0000 mg | Freq: Once | INTRAMUSCULAR | Status: AC
  Administered 2014-06-09: 10 mg via INTRAVENOUS

## 2014-06-09 MED ORDER — SODIUM CHLORIDE 0.9 % IJ SOLN: 3.0000 mL | Freq: Two times a day (BID) | INTRAMUSCULAR | Status: DC

## 2014-06-09 MED ORDER — TECHNETIUM TC 99M SESTAMIBI GENERIC - CARDIOLITE
10.0000 | Freq: Once | INTRAVENOUS | Status: AC | PRN
  Administered 2014-06-09: 10 via INTRAVENOUS

## 2014-06-09 MED ORDER — CARVEDILOL 25 MG PO TABS
25.0000 mg | ORAL_TABLET | Freq: Two times a day (BID) | ORAL | Status: DC
  Administered 2014-06-09 – 2014-06-13 (×8): 25 mg via ORAL
  Filled 2014-06-09: qty 1
  Filled 2014-06-09: qty 2
  Filled 2014-06-09 (×9): qty 1

## 2014-06-09 MED ORDER — HYDRALAZINE HCL 20 MG/ML IJ SOLN
INTRAMUSCULAR | Status: AC
  Administered 2014-06-09: 10 mg via INTRAVENOUS
  Filled 2014-06-09: qty 1

## 2014-06-09 MED ORDER — SODIUM CHLORIDE 0.9 % IV SOLN
INTRAVENOUS | Status: AC
  Administered 2014-06-09: 13:00:00 via INTRAVENOUS

## 2014-06-09 MED ORDER — REGADENOSON 0.4 MG/5ML IV SOLN
INTRAVENOUS | Status: AC
  Administered 2014-06-09: 0.4 mg via INTRAVENOUS
  Filled 2014-06-09: qty 5

## 2014-06-09 MED ORDER — REGADENOSON 0.4 MG/5ML IV SOLN
0.4000 mg | Freq: Once | INTRAVENOUS | Status: AC
Start: 2014-06-09 — End: 2014-06-09
  Administered 2014-06-09: 0.4 mg via INTRAVENOUS
  Filled 2014-06-09: qty 5

## 2014-06-09 NOTE — Progress Notes (Signed)
Patient Profile: 68 y/o female with no prior history of heart disease, but with history of HTN and DM admitted for evaluation of chest pain and elevated troponin.   Subjective: BP currently elevated in Nuc Med, however she is currentlyCP free. Denies SOB and HA.   Objective: Vital signs in last 24 hours: Temp:  [97.6 F (36.4 C)-98.9 F (37.2 C)] 98.6 F (37 C) (07/26 0500) Pulse Rate:  [68-80] 80 (07/26 1000) Resp:  [16-18] 18 (07/26 0500) BP: (143-203)/(50-90) 203/90 mmHg (07/26 1000) SpO2:  [96 %-99 %] 96 % (07/26 0500) Last BM Date: 06/08/14  Intake/Output from previous day: 07/25 0701 - 07/26 0700 In: 763 [P.O.:760; I.V.:3] Out: 1050 [Urine:1050] Intake/Output this shift:    Medications Current Facility-Administered Medications  Medication Dose Route Frequency Provider Last Rate Last Dose  . acetaminophen (TYLENOL) tablet 650 mg  650 mg Oral Q6H PRN Vassie Loll, MD       Or  . acetaminophen (TYLENOL) suppository 650 mg  650 mg Rectal Q6H PRN Vassie Loll, MD      . aspirin EC tablet 325 mg  325 mg Oral Daily Vassie Loll, MD   325 mg at 06/08/14 0005  . atorvastatin (LIPITOR) tablet 40 mg  40 mg Oral q1800 Vassie Loll, MD   40 mg at 06/08/14 1736  . carvedilol (COREG) tablet 12.5 mg  12.5 mg Oral BID WC Vassie Loll, MD   12.5 mg at 06/08/14 1736  . gabapentin (NEURONTIN) capsule 100 mg  100 mg Oral TID Vassie Loll, MD   100 mg at 06/08/14 2316  . heparin injection 5,000 Units  5,000 Units Subcutaneous 3 times per day Vassie Loll, MD   5,000 Units at 06/09/14 0503  . insulin aspart (novoLOG) injection 0-15 Units  0-15 Units Subcutaneous TID WC Elease Etienne, MD      . insulin aspart (novoLOG) injection 0-5 Units  0-5 Units Subcutaneous QHS Elease Etienne, MD      . insulin aspart (novoLOG) injection 4 Units  4 Units Subcutaneous TID WC Elease Etienne, MD      . insulin detemir (LEVEMIR) injection 10 Units  10 Units Subcutaneous BID Elease Etienne, MD    10 Units at 06/08/14 2315  . isosorbide mononitrate (IMDUR) 24 hr tablet 30 mg  30 mg Oral Daily Lova Urbieta M Sw[place], MD   30 mg at 06/08/14 1057  . morphine 2 MG/ML injection 1 mg  1 mg Intravenous Q3H PRN Vassie Loll, MD      . ondansetron [facility]) tablet 4 mg  4 mg Oral Q6H PRN Vassie Loll, MD       Or  . ondansetron [facility]) injection 4 mg  4 mg Intravenous Q6H PRN Vassie Loll, MD      . oxyCODONE (Oxy IR/ROXICODONE) immediate release tablet 5 mg  5 mg Oral Q4H PRN Vassie Loll, MD      . pantoprazole (PROTONIX) EC tablet 40 mg  40 mg Oral Q1200 Vassie Loll, MD   40 mg at 06/08/14 1256  . sodium chloride 0.9 % injection 3 mL  3 mL Intravenous Q12H Vassie Loll, MD   3 mL at 06/08/14 2315    PE: General appearance: alert, cooperative and no distress Lungs: clear to auscultation bilaterally Heart: regular rate and rhythm and 2/6 SM loudest at the apex Extremities: no LEE Pulses: 2+ and symmetric Skin: warm and dry Neurologic: Grossly normal  Lab Results:   Recent Labs  06/08/14 0046 06/09/14 0255  WBC 6.4 6.4  HGB 10.1* 8.6*  HCT 29.6* 25.0*  PLT 210 171   BMET  Recent Labs  06/08/14 0046 06/08/14 0501 06/09/14 0255  NA 137 138 136*  K 4.6 4.4 4.0  CL 100 102 103  CO2 23 24 21   GLUCOSE 369* 294* 185*  BUN 42* 39* 32*  CREATININE 1.53* 1.46* 1.44*  CALCIUM 9.0 9.0 8.6   PT/INR No results found for this basename: LABPROT, INR,  in the last 72 hours Cholesterol  Recent Labs  06/08/14 0500  CHOL 228*   Cardiac Panel (last 3 results)  Recent Labs  06/08/14 0046 06/08/14 0501 06/08/14 1130  TROPONINI 0.47* 0.49* 0.34*    Studies: Lexiscan NST - pending  Assessment/Plan   Principal Problem:   Chest pain Active Problems:   Benign essential HTN   Acute renal failure   Type II or unspecified type diabetes mellitus without mention of complication, uncontrolled   NSTEMI (non-ST elevated myocardial infarction)   HLD (hyperlipidemia)  1.  NSTEMI: currently CP free. Given her renal failure, we will first risk stratify with a Lexiscan NST. If low risk, will plan for medical therapy. If intermediate to high risk, she may need a cardiac cath, once renal function improves. Continue ASA, BB and stain.  2. HTN: BP is severely elevated in nuclear medicine with SBPs >200. Most recent BP was 220/96. She is asymptomatic w/o CP, SOB and HA. Discussed with Dr. SwazilandJordan. We will given 10 mg IV hydralazine prior to Lexiscan. We may proceed if SBP< 200. Continue Coreg and Imdur.  3. Acute Renal Failure: Scr only slightly improved from 1.46 to 1.44.   LOS: 2 days    Brittainy M. Delmer IslamSimmons, PA-C 06/09/2014 10:15 AM  Patient seen and examined and history reviewed. Agree with above findings and plan. Reviewed Nuclear stress test results. MYOCARDIAL IMAGING WITH SPECT (REST AND PHARMACOLOGIC-STRESS)  GATED LEFT VENTRICULAR WALL MOTION STUDY  LEFT VENTRICULAR EJECTION FRACTION  TECHNIQUE: Standard myocardial SPECT imaging was performed after resting intravenous injection of 10 mCi [id number] sestamibi. Subsequently, intravenous infusion of Lexiscan was performed under the supervision of the Cardiology staff. At peak effect of the drug, 30 mCi Tc-4759m sestamibi was injected intravenously and standard myocardial SPECT imaging was performed. Quantitative gated imaging was also performed to evaluate left ventricular wall motion, and estimate left ventricular ejection fraction.  COMPARISON: None.  FINDINGS: SPECT: Fixed defect involving the apex and inferior lateral walls compatible with scar/ prior infarct. On all three views, the apical component of the defect appears slightly larger on the stress views compared to the rest imaging suspicious for a small amount of apical peri-infarct ischemia.  Wall motion: Inferior lateral hypokinesis.  Ejection fraction: Calculated ejection fraction is 63%.  IMPRESSION: Findings suspicious for a small  amounts of peri-infarct inducible ischemia in the anterior apex.  63% ejection fraction   Electronically Signed By: Ruel Favorsrevor Shick M.D. On: 06/09/2014 12:27  This is an intermediate risk study with fixed inferior and lateral walls and reversable ischemia at the apex. EF is normal. I have recommended a cardiac cath to evaluate her coronary status. Will schedule for tomorrow. The procedure and risks were reviewed including but not limited to death, myocardial infarction, stroke, arrythmias, bleeding, transfusion, emergency surgery, dye allergy, or renal dysfunction. The patient voices understanding and is agreeable to proceed. Echo is still pending.  Nakoa Ganus Swazil[place]Jordan, MDFACC 06/09/2014 1:04 PM

## 2014-06-09 NOTE — Progress Notes (Addendum)
PROGRESS NOTE    Janice Love UVO:[id number] DOB: 03-Oct-1946 DOA: 06/07/2014 PCP: No primary provider on file.  HPI/Brief narrative 68 year old female patient with history of recently diagnosed type II DM, hypertension, hyperlipidemia and GERD, lifelong nonsmoker, family history of MI/heart disease in brother, transferred from [facility] hospital with complaints of chest pain. She was hospitalized approximately 3 months ago at [facility] and told to have diabetes and hypertension. She was discharged on oral medications. She was reassessed a week ago with her PCP who stated that her blood pressure was still not adequately controlled. While at work on 06/07/14, she started experiencing upper mid/left-sided chest pain, radiating to her neck, no true relationship to exertion. She left work and went home. She felt dizzy and nauseous. She also had polyuria and weakness. At the outside ED, her blood sugars are apparently in the 600 mg per DL range, acute renal failure-baseline creatinine unknown and mildly elevated troponin. She was transferred to [facility] due to lack of cardiology coverage over the weekend. Cardiology consulted.   Assessment/Plan:  1. NSTEMI: Peak troponin of 0.49. EKG with T wave inversion in lateral leads. Currently chest pain free. CAD risk factors-type II DM, HTN, HLD and family history of CAD. Cardiology consulted and given current renal insufficiency, were hesitant to do cardiac cath which may make her renal failure worse. Thereby, nuclear stress test with Lexi scan Myoview performed which showed reversable ischemia at the apex and normal EF. Cards plans cath 7/27- will continue IV hydration . Continue aspirin, statin and beta blocker. Followup 2-D echo. 2. Uncontrolled type II DM: Hold metformin secondary to renal failure.  A1c: 15.2. Started on Levemir and SSI. Monitor closely. Uncontrolled today- missed insulin doses becoz was NPO and was at stress testing all morning. Titrate  insulins and will likley DC with Insulins. Diabetes education. 3. Uncontrolled hypertension: Continue Coreg. Unable to start ACE inhibitor/ARB secondary to renal failure and may consider when renal failure improves. Imdur & Coreg dose increased. 4. Acute renal failure or chronic kidney disease: Baseline creatinine unknown. Continue IV fluid resuscitation and follow BMP. Creatinine has remained in 1.4 range which may be her baseline. FU BMP in am. 5. Hyperlipidemia: Started on statins. 6. GERD: Continue PPI 7. Anemia: Drop in HB likely dilutional over chronic . Check FOBT and anemia panel.   Code Status: Full Family Communication: d/w sister and niece at bedside. Disposition Plan: Home when medically stable   Consultants:  Cardiology  Procedures:  None  Antibiotics:  None   Subjective: Denies any complaints.   Objective: Filed Vitals:   06/09/14 1052 06/09/14 1054 06/09/14 1056 06/09/14 1408  BP: 150/64 159/63 157/60 158/58  Pulse: 96 98 90 76  Temp:    97.4 F (36.3 C)  TempSrc:    Oral  Resp:    18  Height:      Weight:      SpO2:    99%    Intake/Output Summary (Last 24 hours) at 06/09/14 1631 Last data filed at 06/09/14 0830  Gross per 24 hour  Intake    403 ml  Output    500 ml  Net    -97 ml   Filed Weights   06/07/14 2230 06/08/14 0457  Weight: 72.802 kg (160 lb 8 oz) 72.8 kg (160 lb 7.9 oz)     Exam:  General exam: Pleasant middle-aged female lying comfortably in bed. Respiratory system: Clear. No increased work of breathing. Cardiovascular system: S1 & S2 heard, RRR.  No JVD, murmurs, gallops, clicks or pedal edema. Telemetry: Sinus rhythm. Gastrointestinal system: Abdomen is nondistended, soft and nontender. Normal bowel sounds heard. Central nervous system: Alert and oriented. No focal neurological deficits. Extremities: Symmetric 5 x 5 power.   Data Reviewed: Basic Metabolic Panel:  Recent Labs Lab 06/08/14 0046 06/08/14 0501  06/09/14 0255  NA 137 138 136*  K 4.6 4.4 4.0  CL 100 102 103  CO2 23 24 21   GLUCOSE 369* 294* 185*  BUN 42* 39* 32*  CREATININE 1.53* 1.46* 1.44*  CALCIUM 9.0 9.0 8.6  MG 2.3  --   --   PHOS 3.5  --   --    Liver Function Tests:  Recent Labs Lab 06/08/14 0046  AST 15  ALT 14  ALKPHOS 88  BILITOT 0.3  PROT 7.3  ALBUMIN 3.2*   No results found for this basename: LIPASE, AMYLASE,  in the last 168 hours No results found for this basename: AMMONIA,  in the last 168 hours CBC:  Recent Labs Lab 06/08/14 0046 06/09/14 0255  WBC 6.4 6.4  NEUTROABS 3.5  --   HGB 10.1* 8.6*  HCT 29.6* 25.0*  MCV 84.3 85.3  PLT 210 171   Cardiac Enzymes:  Recent Labs Lab 06/08/14 0046 06/08/14 0501 06/08/14 1130  TROPONINI 0.47* 0.49* 0.34*   BNP (last 3 results) No results found for this basename: PROBNP,  in the last 8760 hours CBG:  Recent Labs Lab 06/08/14 1142 06/08/14 1646 06/08/14 2216 06/09/14 0721 06/09/14 1314  GLUCAP 166* 447* 136* 209* 341*    No results found for this or any previous visit (from the past 240 hour(s)).    Additional labs: 1. Fasting lipids: Cholesterol 228, triglycerides 211, HDL 39, LDL 147 and VLDL 42 2. TSH: 1.170      Studies: Nm Myocar Multi W/spect W/wall Motion / Ef  06/09/2014   CLINICAL DATA:  Chest pain, elevated troponin  EXAM: MYOCARDIAL IMAGING WITH SPECT (REST AND PHARMACOLOGIC-STRESS)  GATED LEFT VENTRICULAR WALL MOTION STUDY  LEFT VENTRICULAR EJECTION FRACTION  TECHNIQUE: Standard myocardial SPECT imaging was performed after resting intravenous injection of 10 mCi Tc-43m sestamibi. Subsequently, intravenous infusion of Lexiscan was performed under the supervision of the Cardiology staff. At peak effect of the drug, 30 mCi Tc-78m sestamibi was injected intravenously and standard myocardial SPECT imaging was performed. Quantitative gated imaging was also performed to evaluate left ventricular wall motion, and estimate left  ventricular ejection fraction.  COMPARISON:  None.  FINDINGS: SPECT: Fixed defect involving the apex and inferior lateral walls compatible with scar/ prior infarct. On all three views, the apical component of the defect appears slightly larger on the stress views compared to the rest imaging suspicious for a small amount of apical peri-infarct ischemia.  Wall motion:  Inferior lateral hypokinesis.  Ejection fraction:  Calculated ejection fraction is 63%.  IMPRESSION: Findings suspicious for a small amounts of peri-infarct inducible ischemia in the anterior apex.  63% ejection fraction   Electronically Signed   By: Ruel Favors M.D.   On: 06/09/2014 12:27   Dg Chest Port 1 View  06/08/2014   CLINICAL DATA:  Chest pain. Current history of hypertension, diabetes and hyperlipidemia.  EXAM: PORTABLE CHEST - 1 VIEW  COMPARISON:  None.  FINDINGS: Cardiac silhouette upper normal in size to perhaps minimally enlarged for the AP portable technique. Thoracic aorta mildly tortuous. Hilar and mediastinal contours otherwise unremarkable. Lungs clear. Bronchovascular markings normal. Pulmonary vascularity normal. No visible pleural effusions.  No pneumothorax.  IMPRESSION: Borderline to mild cardiomegaly.  No acute cardiopulmonary disease.   Electronically Signed   By: Hulan Saashomas  Lawrence M.D.   On: 06/08/2014 08:16        Scheduled Meds: . aspirin EC  325 mg Oral Daily  . atorvastatin  40 mg Oral q1800  . carvedilol  25 mg Oral BID WC  . gabapentin  100 mg Oral TID  . heparin  5,000 Units Subcutaneous 3 times per day  . insulin aspart  0-15 Units Subcutaneous TID WC  . insulin aspart  0-5 Units Subcutaneous QHS  . insulin aspart  4 Units Subcutaneous TID WC  . insulin detemir  10 Units Subcutaneous BID  . [START ON 06/10/2014] isosorbide mononitrate  60 mg Oral Daily  . pantoprazole  40 mg Oral Q1200  . sodium chloride  3 mL Intravenous Q12H  . sodium chloride  3 mL Intravenous Q12H   Continuous Infusions: .  sodium chloride    . sodium chloride 100 mL/hr at 06/09/14 1323    Principal Problem:   Chest pain Active Problems:   Benign essential HTN   Acute renal failure   Type II or unspecified type diabetes mellitus without mention of complication, uncontrolled   NSTEMI (non-ST elevated myocardial infarction)   HLD (hyperlipidemia)    Time spent: 40 minutes.    Marcellus ScottHONGALGI,Robyne Matar, MD, FACP, FHM. [facility] Pager [phone]  If 7PM-7AM, please contact night-coverage [email or web address] Password [email or web address] 06/09/2014, 4:31 PM    LOS: 2 days

## 2014-06-09 NOTE — Progress Notes (Signed)
INITIAL NUTRITION ASSESSMENT  DOCUMENTATION CODES Per approved criteria  -Unable to assess   INTERVENTION: 1.  Supplements; consider supplements based on PO intake once diet resumed. 2.  Brief education; provided.  RD left handout "Nutrition Therapy for Type II Diabetes" in patient's room with RD contact information.   NUTRITION DIAGNOSIS: Inadequate oral intake related to omission of energy dense foods as evidenced by pt NPO.   Monitor:  1.  Food/Beverage; resume of PO diet with pt meeting >/=90% estimated needs with tolerance. 2.  Wt/wt change; monitor trends  Reason for Assessment: MST  68 y.o. female  Admitting Dx: Chest pain  ASSESSMENT: Pt admitted with chest pain. Pt with recent dx of Type 2 diabetes. Elevated glucose on admission.   Pt unavailable at time of visit- in nuclear medicine.  RD notes pt's reportedly unsure of recent wt loss. No medical records available to review.   RD left handout "Nutrition Therapy for Type 2 Diabetes" in room for pt.  Will continue to follow.   Height: Ht Readings from Last 1 Encounters:  06/07/14 5\' 3"  (1.6 m)    Weight: Wt Readings from Last 1 Encounters:  06/08/14 160 lb 7.9 oz (72.8 kg)    Ideal Body Weight: 115 lbs  % Ideal Body Weight: 139%  Wt Readings from Last 10 Encounters:  06/08/14 160 lb 7.9 oz (72.8 kg)    Usual Body Weight: unknown  % Usual Body Weight: unable to be assessed  BMI:  Body mass index is 28.44 kg/(m^2).  Estimated Nutritional Needs: Kcal: 1500-1700 Protein: 87-95g Fluid: ~2.0 L/day  Skin: intact  Diet Order: NPO  EDUCATION NEEDS: -Education needs addressed   Intake/Output Summary (Last 24 hours) at 06/09/14 0909 Last data filed at 06/09/14 0830  Gross per 24 hour  Intake    763 ml  Output    500 ml  Net    263 ml    Last BM: 7/25  Labs:   Recent Labs Lab 06/08/14 0046 06/08/14 0501 06/09/14 0255  NA 137 138 136*  K 4.6 4.4 4.0  CL 100 102 103  CO2 23 24 21    BUN 42* 39* 32*  CREATININE 1.53* 1.46* 1.44*  CALCIUM 9.0 9.0 8.6  MG 2.3  --   --   PHOS 3.5  --   --   GLUCOSE 369* 294* 185*    CBG (last 3)   Recent Labs  06/08/14 1646 06/08/14 2216 06/09/14 0721  GLUCAP 447* 136* 209*    Scheduled Meds: . regadenoson      . aspirin EC  325 mg Oral Daily  . atorvastatin  40 mg Oral q1800  . carvedilol  12.5 mg Oral BID WC  . gabapentin  100 mg Oral TID  . heparin  5,000 Units Subcutaneous 3 times per day  . insulin aspart  0-15 Units Subcutaneous TID WC  . insulin aspart  0-5 Units Subcutaneous QHS  . insulin aspart  4 Units Subcutaneous TID WC  . insulin detemir  10 Units Subcutaneous BID  . isosorbide mononitrate  30 mg Oral Daily  . pantoprazole  40 mg Oral Q1200  . regadenoson  0.4 mg Intravenous Once  . sodium chloride  3 mL Intravenous Q12H    Continuous Infusions:   Past Medical History  Diagnosis Date  . Hypertension   . Diabetes mellitus without complication   . Hyperlipidemia     History reviewed. No pertinent past surgical history.  Loyce DysKacie Alixis Harmon, MS RD LDN Clinical  Inpatient Dietitian Pager: [phone] Weekend/After hours pager: [phone]

## 2014-06-10 ENCOUNTER — Encounter (HOSPITAL_COMMUNITY)
Admission: EM | Disposition: A | Discharge status: Home / Self Care | Payer: Self-pay | Source: Other Acute Inpatient Hospital | Attending: Internal Medicine

## 2014-06-10 DIAGNOSIS — N189 Chronic kidney disease, unspecified: Secondary | ICD-10-CM

## 2014-06-10 DIAGNOSIS — R7989 Other specified abnormal findings of blood chemistry: Secondary | ICD-10-CM

## 2014-06-10 HISTORY — PX: LEFT HEART CATHETERIZATION WITH CORONARY ANGIOGRAM: SHX5451

## 2014-06-10 LAB — URINALYSIS, ROUTINE W REFLEX MICROSCOPIC
Bilirubin Urine: NEGATIVE
Hgb urine dipstick: NEGATIVE
Ketones, ur: NEGATIVE mg/dL
LEUKOCYTES UA: NEGATIVE
Nitrite: NEGATIVE
PH: 5 (ref 5.0–8.0)
Protein, ur: NEGATIVE mg/dL
SPECIFIC GRAVITY, URINE: 1.037 — AB (ref 1.005–1.030)
Urobilinogen, UA: 0.2 mg/dL (ref 0.0–1.0)

## 2014-06-10 LAB — BASIC METABOLIC PANEL
ANION GAP: 13 (ref 5–15)
BUN: 22 mg/dL (ref 6–23)
CHLORIDE: 103 meq/L (ref 96–112)
CO2: 20 mEq/L (ref 19–32)
Calcium: 8.4 mg/dL (ref 8.4–10.5)
Creatinine, Ser: 1.41 mg/dL — ABNORMAL HIGH (ref 0.50–1.10)
GFR calc Af Amer: 43 mL/min — ABNORMAL LOW (ref >90)
GFR calc non Af Amer: 37 mL/min — ABNORMAL LOW (ref >90)
Glucose, Bld: 177 mg/dL — ABNORMAL HIGH (ref 70–99)
POTASSIUM: 3.8 meq/L (ref 3.7–5.3)
Sodium: 136 mEq/L — ABNORMAL LOW (ref 137–147)

## 2014-06-10 LAB — GLUCOSE, CAPILLARY
GLUCOSE-CAPILLARY: 181 mg/dL — AB (ref 70–99)
GLUCOSE-CAPILLARY: 116 mg/dL — AB (ref 70–99)
GLUCOSE-CAPILLARY: 119 mg/dL — AB (ref 70–99)
Glucose-Capillary: 203 mg/dL — ABNORMAL HIGH (ref 70–99)

## 2014-06-10 LAB — CBC
HCT: 27.3 % — ABNORMAL LOW (ref 36.0–46.0)
Hemoglobin: 9.3 g/dL — ABNORMAL LOW (ref 12.0–15.0)
MCH: 29.5 pg (ref 26.0–34.0)
MCHC: 34.1 g/dL (ref 30.0–36.0)
MCV: 86.7 fL (ref 78.0–100.0)
PLATELETS: 169 10*3/uL (ref 150–400)
RBC: 3.15 MIL/uL — AB (ref 3.87–5.11)
RDW: 12.9 % (ref 11.5–15.5)
WBC: 5.6 10*3/uL (ref 4.0–10.5)

## 2014-06-10 LAB — URINE MICROSCOPIC-ADD ON

## 2014-06-10 SURGERY — LEFT HEART CATHETERIZATION WITH CORONARY ANGIOGRAM
Anesthesia: LOCAL

## 2014-06-10 MED ORDER — INSULIN DETEMIR 100 UNIT/ML ~~LOC~~ SOLN
8.0000 [IU] | Freq: Once | SUBCUTANEOUS | Status: AC
  Administered 2014-06-10: 8 [IU] via SUBCUTANEOUS
  Filled 2014-06-10: qty 0.08

## 2014-06-10 MED ORDER — HEART ATTACK BOUNCING BOOK
Freq: Once | Status: AC
  Administered 2014-06-10: 19:00:00
  Filled 2014-06-10: qty 1

## 2014-06-10 MED ORDER — INSULIN ASPART 100 UNIT/ML ~~LOC~~ SOLN: 0.0000 [IU] | SUBCUTANEOUS | Status: DC

## 2014-06-10 MED ORDER — MIDAZOLAM HCL 2 MG/2ML IJ SOLN
INTRAMUSCULAR | Status: AC
  Filled 2014-06-10: qty 2

## 2014-06-10 MED ORDER — ATORVASTATIN CALCIUM 80 MG PO TABS
80.0000 mg | ORAL_TABLET | Freq: Every day | ORAL | Status: DC
  Administered 2014-06-10 – 2014-06-12 (×2): 80 mg via ORAL
  Filled 2014-06-10 (×4): qty 1

## 2014-06-10 MED ORDER — LIDOCAINE HCL (PF) 1 % IJ SOLN
INTRAMUSCULAR | Status: AC
  Filled 2014-06-10: qty 30

## 2014-06-10 MED ORDER — HEPARIN SODIUM (PORCINE) 1000 UNIT/ML IJ SOLN
INTRAMUSCULAR | Status: AC
  Filled 2014-06-10: qty 1

## 2014-06-10 MED ORDER — INSULIN STARTER KIT- SYRINGES (ENGLISH)
1.0000 | Freq: Once | Status: AC
  Administered 2014-06-10: 1
  Filled 2014-06-10 (×2): qty 1

## 2014-06-10 MED ORDER — NITROGLYCERIN 1 MG/10 ML FOR IR/CATH LAB
INTRA_ARTERIAL | Status: AC
  Filled 2014-06-10: qty 10

## 2014-06-10 MED ORDER — VERAPAMIL HCL 2.5 MG/ML IV SOLN
INTRAVENOUS | Status: AC
  Filled 2014-06-10: qty 2

## 2014-06-10 MED ORDER — SODIUM CHLORIDE 0.9 % IV SOLN
INTRAVENOUS | Status: DC
  Administered 2014-06-10 (×3): via INTRAVENOUS

## 2014-06-10 MED ORDER — HEPARIN (PORCINE) IN NACL 100-0.45 UNIT/ML-% IJ SOLN
850.0000 [IU]/h | INTRAMUSCULAR | Status: DC
  Administered 2014-06-10: 23:00:00 850 [IU]/h via INTRAVENOUS
  Filled 2014-06-10 (×3): qty 250

## 2014-06-10 MED ORDER — HEPARIN (PORCINE) IN NACL 2-0.9 UNIT/ML-% IJ SOLN
INTRAMUSCULAR | Status: AC
  Filled 2014-06-10: qty 1000

## 2014-06-10 MED ORDER — FENTANYL CITRATE 0.05 MG/ML IJ SOLN
INTRAMUSCULAR | Status: AC
  Filled 2014-06-10: qty 2

## 2014-06-10 MED ORDER — INSULIN ASPART 100 UNIT/ML ~~LOC~~ SOLN: 0.0000 [IU] | Freq: Three times a day (TID) | SUBCUTANEOUS | Status: DC

## 2014-06-10 MED ORDER — ACETAMINOPHEN 325 MG PO TABS: 650.0000 mg | ORAL_TABLET | ORAL | Status: DC | PRN

## 2014-06-10 MED ORDER — INSULIN DETEMIR 100 UNIT/ML ~~LOC~~ SOLN
15.0000 [IU] | Freq: Two times a day (BID) | SUBCUTANEOUS | Status: DC
  Administered 2014-06-10: 15 [IU] via SUBCUTANEOUS
  Filled 2014-06-10 (×3): qty 0.15

## 2014-06-10 MED ORDER — ONDANSETRON HCL 4 MG/2ML IJ SOLN: 4.0000 mg | Freq: Four times a day (QID) | INTRAMUSCULAR | Status: DC | PRN

## 2014-06-10 MED ORDER — LIVING WELL WITH DIABETES BOOK
Freq: Once | Status: AC
  Administered 2014-06-10: 15:00:00
  Filled 2014-06-10: qty 1

## 2014-06-10 NOTE — Progress Notes (Signed)
PROGRESS NOTE    Janice Love [id number] DOB: 1946/01/07 DOA: 06/07/2014 PCP: No primary provider on file.  HPI/Brief narrative 68 year old female patient with history of recently diagnosed type II DM, hypertension, hyperlipidemia and GERD, lifelong nonsmoker, family history of MI/heart disease in brother, [facility] with complaints of chest pain. She was hospitalized approximately 3 months [facility] and told to have diabetes and hypertension. She was discharged on oral medications. She was reassessed a week ago with her PCP who stated that her blood pressure was still not adequately controlled. While at work on 06/07/14, she started experiencing upper mid/left-sided chest pain, radiating to her neck, no true relationship to exertion. She left work and went home. She felt dizzy and nauseous. She also had polyuria and weakness. At the outside ED, her blood sugars are apparently in the 600 mg per DL range, acute renal failure-baseline creatinine unknown and mildly elevated troponin. She was transferred to [facility] due to lack of cardiology coverage over the weekend. Cardiology consulted.   Assessment/Plan:  1. NSTEMI: Peak troponin of 0.49. EKG with T wave inversion in lateral leads. Currently chest pain free. CAD risk factors-type II DM, HTN, HLD and family history of CAD. Cardiology consulted and given current renal insufficiency, were hesitant to do cardiac cath which may make her renal failure worse. Thereby, nuclear stress test with Lexi scan Myoview performed which showed reversable ischemia at the apex and normal EF. Cards plans cath 7/27- will continue IV hydration . Continue aspirin, statin and beta blocker. 2-D echo results pending. 2. Uncontrolled type II DM: Hold metformin secondary to renal failure.  A1c: 15.2. Started on Levemir and SSI. Monitor closely. Titrate insulins and will DC with Insulins. Diabetes education. Will give half dose of Levemir  this morning and change sliding scale to sensitive q. 4-patient n.p.o. for cardiac cath at 3 PM. Resume usual dose post cath. 3. Uncontrolled hypertension: Continue Coreg. Unable to start ACE inhibitor/ARB secondary to renal failure and may consider when renal failure improves. Imdur & Coreg dose increased. Blood pressure controlled. 4. Acute renal failure or chronic kidney disease stage III: Baseline creatinine unknown. Continue IV fluid resuscitation and follow BMP. Creatinine has slowly decreased from 1.53 on 7/25 > 1.4 on 7/27. 5. Hyperlipidemia: Started on statins. 6. GERD: Continue PPI 7. Anemia of chronic disease: Stable. Anemia panel reviewed-suggestive of chronic disease.   Code Status: Full Family Communication: None at bedside. Disposition Plan: Home when medically stable   Consultants:  Cardiology  Procedures:  None  Antibiotics:  None   Subjective: Denies complaints. Denies chest pain.  Objective: Filed Vitals:   06/09/14 1056 06/09/14 1408 06/09/14 2100 06/10/14 0440  BP: 157/60 158/58 150/56 139/57  Pulse: 90 76 81 74  Temp:  97.4 F (36.3 C) 100.9 F (38.3 C) 98.8 F (37.1 C)  TempSrc:  Oral    Resp:  18 15   Height:      Weight:      SpO2:  99%  98%    Intake/Output Summary (Last 24 hours) at 06/10/14 1045 Last data filed at 06/09/14 1742  Gross per 24 hour  Intake    240 ml  Output      0 ml  Net    240 ml   Filed Weights   06/07/14 2230 06/08/14 0457  Weight: 72.802 kg (160 lb 8 oz) 72.8 kg (160 lb 7.9 oz)     Exam:  General exam: Pleasant middle-aged female lying  comfortably in bed. Respiratory system: Clear. No increased work of breathing. Cardiovascular system: S1 & S2 heard, RRR. No JVD, murmurs, gallops, clicks or pedal edema. Telemetry: Sinus rhythm. Gastrointestinal system: Abdomen is nondistended, soft and nontender. Normal bowel sounds heard. Central nervous system: Alert and oriented. No focal neurological  deficits. Extremities: Symmetric 5 x 5 power.   Data Reviewed: Basic Metabolic Panel:  Recent Labs Lab 06/08/14 0046 06/08/14 0501 06/09/14 0255 06/10/14 0400  NA 137 138 136* 136*  K 4.6 4.4 4.0 3.8  CL 100 102 103 103  CO2 23 24 21 20   GLUCOSE 369* 294* 185* 177*  BUN 42* 39* 32* 22  CREATININE 1.53* 1.46* 1.44* 1.41*  CALCIUM 9.0 9.0 8.6 8.4  MG 2.3  --   --   --   PHOS 3.5  --   --   --    Liver Function Tests:  Recent Labs Lab 06/08/14 0046  AST 15  ALT 14  ALKPHOS 88  BILITOT 0.3  PROT 7.3  ALBUMIN 3.2*   No results found for this basename: LIPASE, AMYLASE,  in the last 168 hours No results found for this basename: AMMONIA,  in the last 168 hours CBC:  Recent Labs Lab 06/08/14 0046 06/09/14 0255 06/10/14 0400  WBC 6.4 6.4 5.6  NEUTROABS 3.5  --   --   HGB 10.1* 8.6* 9.3*  HCT 29.6* 25.0* 27.3*  MCV 84.3 85.3 86.7  PLT 210 171 169   Cardiac Enzymes:  Recent Labs Lab 06/08/14 0046 06/08/14 0501 06/08/14 1130  TROPONINI 0.47* 0.49* 0.34*   BNP (last 3 results) No results found for this basename: PROBNP,  in the last 8760 hours CBG:  Recent Labs Lab 06/09/14 0721 06/09/14 1314 06/09/14 1658 06/09/14 2133 06/10/14 0729  GLUCAP 209* 341* 278* 143* 181*    No results found for this or any previous visit (from the past 240 hour(s)).    Additional labs: 1. Fasting lipids: Cholesterol 228, triglycerides 211, HDL 39, LDL 147 and VLDL 42 2. TSH: 1.170  3. Anemia panel: Iron 102, TIBC 283, saturation ratios 36, ferritin 348, folate >20, B12: 955 and reticulocyte count 77.4     Studies: Nm Myocar Multi W/spect W/wall Motion / Ef  06/09/2014   CLINICAL DATA:  Chest pain, elevated troponin  EXAM: MYOCARDIAL IMAGING WITH SPECT (REST AND PHARMACOLOGIC-STRESS)  GATED LEFT VENTRICULAR WALL MOTION STUDY  LEFT VENTRICULAR EJECTION FRACTION  TECHNIQUE: Standard myocardial SPECT imaging was performed after resting intravenous injection of 10 mCi  Tc-20m sestamibi. Subsequently, intravenous infusion of Lexiscan was performed under the supervision of the Cardiology staff. At peak effect of the drug, 30 mCi Tc-7m sestamibi was injected intravenously and standard myocardial SPECT imaging was performed. Quantitative gated imaging was also performed to evaluate left ventricular wall motion, and estimate left ventricular ejection fraction.  COMPARISON:  None.  FINDINGS: SPECT: Fixed defect involving the apex and inferior lateral walls compatible with scar/ prior infarct. On all three views, the apical component of the defect appears slightly larger on the stress views compared to the rest imaging suspicious for a small amount of apical peri-infarct ischemia.  Wall motion:  Inferior lateral hypokinesis.  Ejection fraction:  Calculated ejection fraction is 63%.  IMPRESSION: Findings suspicious for a small amounts of peri-infarct inducible ischemia in the anterior apex.  63% ejection fraction   Electronically Signed   By: Ruel Favors M.D.   On: 06/09/2014 12:27        Scheduled Meds: .  aspirin EC  325 mg Oral Daily  . atorvastatin  40 mg Oral q1800  . carvedilol  25 mg Oral BID WC  . gabapentin  100 mg Oral TID  . heparin  5,000 Units Subcutaneous 3 times per day  . insulin aspart  0-15 Units Subcutaneous TID WC  . insulin aspart  0-5 Units Subcutaneous QHS  . insulin aspart  0-9 Units Subcutaneous 6 times per day  . insulin aspart  4 Units Subcutaneous TID WC  . insulin detemir  15 Units Subcutaneous BID  . isosorbide mononitrate  60 mg Oral Daily  . pantoprazole  40 mg Oral Q1200  . sodium chloride  3 mL Intravenous Q12H  . sodium chloride  3 mL Intravenous Q12H   Continuous Infusions: . sodium chloride 1 mL/kg/hr (06/09/14 2000)    Principal Problem:   Chest pain Active Problems:   Benign essential HTN   Acute renal failure   Type II or unspecified type diabetes mellitus without mention of complication, uncontrolled   NSTEMI  (non-ST elevated myocardial infarction)   HLD (hyperlipidemia)    Time spent: 20 minutes.    Marcellus Scott, MD, FACP, FHM. [facility] Pager [phone]  If 7PM-7AM, please contact night-coverage [email or web address] Password [email or web address] 06/10/2014, 10:45 AM    LOS: 3 days

## 2014-06-10 NOTE — Interval H&P Note (Signed)
Cath Lab Visit (complete for each Cath Lab visit)  Clinical Evaluation Leading to the Procedure:   ACS: Yes.    Non-ACS:    Anginal Classification: CCS III  Anti-ischemic medical therapy: Maximal Therapy (2 or more classes of medications)  Non-Invasive Test Results: High-risk stress test findings: cardiac mortality >3%/year  Prior CABG: No previous CABG      History and Physical Interval Note:  06/10/2014 1:20 PM  Janice Love  has presented today for surgery, with the diagnosis of chest pain  The various methods of treatment have been discussed with the patient and family. After consideration of risks, benefits and other options for treatment, the patient has consented to  Procedure(s): LEFT HEART CATHETERIZATION WITH CORONARY ANGIOGRAM (N/A) as a surgical intervention .  The patient's history has been reviewed, patient examined, no change in status, stable for surgery.  I have reviewed the patient's chart and labs.  Questions were answered to the patient's satisfaction.     KELLY,THOMAS A

## 2014-06-10 NOTE — Progress Notes (Signed)
Order for sheath removal verified per post procedural orders. Procedure explained to patient and Rt femoral artery access site assessed: level 0,doppler  dorsalis pedis and posterior tibial pulses.5 [place] She[place] removed and manual pressure applied for 20 minutes. Pre, peri, & post procedural vitals: HR 56, RR 12, O2 Sat  100% RA, BP 140/58.  Pain 0. Distal pulses remained intact after sheath removal. Access site level 0 and dressed with 4X4 gauze and tegaderm.  Rosanne AshingJim, RN confirmed condition of site. Post procedural instructions discussed with return demonstration from patient.

## 2014-06-10 NOTE — H&P (View-Only) (Signed)
Patient Profile: 68 y/o female with no prior history of heart disease, but with history of HTN and DM admitted for evaluation of chest pain and elevated troponin.   Subjective:  currentlyCP free. Denies SOB and HA.   Objective: Vital signs in last 24 hours: Temp:  [97.4 F (36.3 C)-100.9 F (38.3 C)] 98.8 F (37.1 C) (07/27 0440) Pulse Rate:  [74-81] 74 (07/27 0440) Resp:  [15-18] 15 (07/26 2100) BP: (139-158)/(56-58) 139/57 mmHg (07/27 0440) SpO2:  [98 %-99 %] 98 % (07/27 0440) Last BM Date: 06/09/14  Intake/Output from previous day: 07/26 0701 - 07/27 0700 In: 240 [P.O.:240] Out: -  Intake/Output this shift:    Medications Current Facility-Administered Medications  Medication Dose Route Frequency Provider Last Rate Last Dose  . 0.9 %  sodium chloride infusion  250 mL Intravenous PRN Peter M Martinique, MD      . 0.9 %  sodium chloride infusion  1 mL/kg/hr Intravenous Continuous Peter M Martinique, MD 72.8 mL/hr at 06/09/14 2000 1 mL/kg/hr at 06/09/14 2000  . acetaminophen (TYLENOL) tablet 650 mg  650 mg Oral Q6H PRN Barton Dubois, MD   650 mg at 06/09/14 2306   Or  . acetaminophen (TYLENOL) suppository 650 mg  650 mg Rectal Q6H PRN Barton Dubois, MD      . aspirin EC tablet 325 mg  325 mg Oral Daily Barton Dubois, MD   325 mg at 06/10/14 1000  . atorvastatin (LIPITOR) tablet 40 mg  40 mg Oral q1800 Barton Dubois, MD   40 mg at 06/09/14 1658  . carvedilol (COREG) tablet 25 mg  25 mg Oral BID WC Peter M Martinique, MD   25 mg at 06/10/14 1000  . gabapentin (NEURONTIN) capsule 100 mg  100 mg Oral TID Barton Dubois, MD   100 mg at 06/10/14 1000  . heparin injection 5,000 Units  5,000 Units Subcutaneous 3 times per day Barton Dubois, MD   5,000 Units at 06/09/14 2307  . insulin aspart (novoLOG) injection 0-15 Units  0-15 Units Subcutaneous TID WC Modena Jansky, MD      . insulin aspart (novoLOG) injection 0-5 Units  0-5 Units Subcutaneous QHS Modena Jansky, MD      . insulin aspart  (novoLOG) injection 0-9 Units  0-9 Units Subcutaneous 6 times per day Modena Jansky, MD      . insulin aspart (novoLOG) injection 4 Units  4 Units Subcutaneous TID WC Modena Jansky, MD   4 Units at 06/09/14 1658  . insulin detemir (LEVEMIR) injection 15 Units  15 Units Subcutaneous BID Modena Jansky, MD      . insulin starter kit- syringes (English) 1 kit  1 kit Other Once Modena Jansky, MD      . isosorbide mononitrate (IMDUR) 24 hr tablet 60 mg  60 mg Oral Daily Peter M Martinique, MD   60 mg at 06/10/14 1000  . living well with diabetes book MISC   Does not apply Once Modena Jansky, MD      . morphine 2 MG/ML injection 1 mg  1 mg Intravenous Q3H PRN Barton Dubois, MD      . ondansetron [facility]) tablet 4 mg  4 mg Oral Q6H PRN Barton Dubois, MD       Or  . ondansetron [facility]) injection 4 mg  4 mg Intravenous Q6H PRN Barton Dubois, MD      . oxyCODONE (Oxy IR/ROXICODONE) immediate release tablet 5 mg  5 mg Oral  Q4H PRN Barton Dubois, MD      . pantoprazole (PROTONIX) EC tablet 40 mg  40 mg Oral Q1200 Barton Dubois, MD   40 mg at 06/10/14 1000  . sodium chloride 0.9 % injection 3 mL  3 mL Intravenous Q12H Barton Dubois, MD   3 mL at 06/08/14 2315  . sodium chloride 0.9 % injection 3 mL  3 mL Intravenous Q12H Peter M Martinique, MD      . sodium chloride 0.9 % injection 3 mL  3 mL Intravenous PRN Peter M Martinique, MD        PE: General appearance: alert, cooperative and no distress Lungs: clear to auscultation bilaterally Heart: regular rate and rhythm and 2/6 SM loudest at the apex Extremities: no LEE Pulses: 2+ and symmetric Skin: warm and dry Neurologic: Grossly normal 2+ right radial pulse  Lab Results:   Recent Labs  06/08/14 0046 06/09/14 0255 06/10/14 0400  WBC 6.4 6.4 5.6  HGB 10.1* 8.6* 9.3*  HCT 29.6* 25.0* 27.3*  PLT 210 171 169   BMET  Recent Labs  06/08/14 0501 06/09/14 0255 06/10/14 0400  NA 138 136* 136*  K 4.4 4.0 3.8  CL 102 103 103  CO2 _0 GLUCOSE 294* 185* 177*  BUN 39* 32* 22  CREATININE 1.46* 1.44* 1.41*  CALCIUM 9.0 8.6 8.4   PT/INR No results found for this basename: LABPROT, INR,  in the last 72 hours Cholesterol  Recent Labs  06/08/14 0500  CHOL 228*   Cardiac Panel (last 3 results)  Recent Labs  06/08/14 0046 06/08/14 0501 06/08/14 1130  TROPONINI 0.47* 0.49* 0.34*    Studies: Lexiscan NST - pending  Assessment/Plan   Principal Problem:   Chest pain Active Problems:   Benign essential HTN   Acute renal failure   Type II or unspecified type diabetes mellitus without mention of complication, uncontrolled   NSTEMI (non-ST elevated myocardial infarction)   HLD (hyperlipidemia)  1. NSTEMI: currently CP free. Planning for cardiac cath today. Continue ASA, BB and statin.  All questions answered.  Question prior infarct on the nuclear scan with mild peri-infarct ischemia.  2. HTN: BP is intermittently severely elevated.   Continue Coreg and Imdur.  3. Acute Renal Failure: Scr only slightly improved from 1.46 to 1.41.   LOS: 3 days     Jettie Booze., [place] 06/10/2014 12:36 PM

## 2014-06-10 NOTE — Care Management Note (Addendum)
    Page 1 of 1   06/13/2014     11:13:14 AM CARE MANAGEMENT NOTE 06/13/2014  Patient:  Janice Love,Janice Love   Account Number:  [id number]  Date Initiated:  06/10/2014  Documentation initiated by:  GRAVES-BIGELOW,Locke Barrell  Subjective/Objective Assessment:   Pt admitted for NSTEMI-cath 06-10-14.  Plan for home once stable.     Action/Plan:   CM did provide pt with information for [place]CP in [place]. No furhter needs from CM at tis time.   Anticipated DC Date:  06/11/2014   Anticipated DC Plan:  HOME/SELF CARE      DC Planning Services  CM consult      Choice offered to / List presented to:             Status of service:  Completed, signed off Medicare Important Message given?  YES (If response is "NO", the following Medicare IM given date fields will be blank) Date Medicare IM given:  06/11/2014 Medicare IM given by:  [facility],CAMELLIA Date Additional Medicare IM given:  06/13/2014 Additional Medicare IM given by:  Mosie Angus GRAVES-BIGELOW  Discharge Disposition:  HOME/SELF CARE  Per UR Regulation:  Reviewed for med. necessity/level of care/duration of stay  If discussed at Long Length of Stay Meetings, dates discussed:    Comments:   06-13-14 1108 Tomi BambergerBrenda Graves-Bigelow, RN,BSN [phone] Pt will need the patient assistance form for effient filled out by MD before d/Love. Pt will have to fax to company once completed. Pt has card for 30 day free. Diabetes Coordinator called CM and we discussed insulin regimen. Pt will need 70/30 based on cost issues. No further needs at this time.   06-13-14 [address] Graves-Bigelow, [place],BSN [phone] CM did provide pt with effient 30 day free card. [facility] [place] Pharmacy has medication available. Pt will need 30 day Rx for effient no refills and the original. Benefits check in for effient. Will make pt aware once complete. No further needs from CM at this time.

## 2014-06-10 NOTE — CV Procedure (Addendum)
Janice Love is a 68 y.o. female   [id number]  [id number] LOCATION:  FACILITY: [facility]  PHYSICIAN: Lennette Bihari, MD, [facility] 12/28/1945   DATE OF PROCEDURE:  06/10/2014     CARDIAC CATHETERIZATION    HISTORY:  Janice Love is a 68 year old African American female who presented to [facility] with substernal pressure, polyuria, weakness and leg numbness.  She was diagnosed with diabetes approximately 3 months ago.  Blood sugar was elevated at 600, creatinine 1.97, and troponin 0.11.  She was transported to [facility].  She has ruled in for non-ST segment elevation myocardial infarction.  Her creatinine has improved to 1.41 , and she presents for definitive cardiac catheterization.   PROCEDURE: Left heart catheterization: Coronary angiography, selective injection of IC nitroglycerin into the left coronary system and right coronary artery, left ventriculography.  The patient was brought to the Cardiac cath lab in the postabsorptive state. She was premedicated with Versed 2 mg and fentanyl 25   mcg. A right radial approach was utilized after an Allen's test verified adequate circulation. The right radial artery was punctured via the Seldinger technique, and a 6 [place] Glidesheath Slender was inserted without difficulty.  A radial cocktail consisting of Verapamil, IV nitroglycerin, and lidocaine was administered.  A safety J wire was advanced into the right subclavian, but was not able to be advanced further.  This was then exchanged for versicor wire and advanced in the ascending aorta to the sinus of Valsalva.  However, the right coronary catheter was unable to be advanced beyond the subclavian system.  The patient received additional IV nitroglycerin.  There was significant amount of the catheter spasm preventing the catheters further advancement The decision was made to abort the radial approach.  The right groin had been prepped.  The right femoral artery was punctured without difficulty  and diagnostic catheterization was done with 5 [place] JR 4 and JL 4 catheters .  The patient received 200 mcg of intracoronary NTG to evaluate the ostia of the circumflex certain that there was not a component of significant vasospasm.  In addition, with the demonstration of high-grade ostial RCA disease, 200 micrograms of IC nitroglycerin X 2 were injected into the RCA to make certain that the subtotal ostial leasion was not spasm. A 5 French pigtail catheter was used for left ventriculography. A TR radial band was applied for hemostasis.  Hemostasis to the right groin was applied by direct manual pressure.  The patient left the catheterization laboratory in stable condition.   HEMODYNAMICS:   Central Aorta: 138/55  Left Ventricle: 138/20  ANGIOGRAPHY:   The left main coronary artery was essentially a common ostium, which immediately bifurcated into the  LAD and left circumflex coronary artery.   The LAD was a moderate size vessel.  There was 70% to snaring the midportion a sharp bend in the diagonal vessel.  The LAD after the septal perforating artery proximally had 40% stenosis that was followed by 50% stenosis. In the mid LAD there were segmental narrowings of 50%. The vessel extended to the LV apex.   The left circumflex coronary artery immediately arose from the apparent common ostium.  There was narrowing of 50% ostially.  This did not significantly improve following IC nitroglycerin administration.  After a small left atrial circumflex branch, there was 50-60% narrowing of the bend in the vessel.  There was 70% narrowing and a sharp bend in a tortuous OM1 vessel obtuse marginal branch.   The RCA was somewhat  of a superior takeoff.  He was diffuse ostial 95% stenosis.  There was mild catheter dampening.  This did not improve with IC nitroglycerin administration x2.  It was also 20% mid RCA stenosis.  There was 50% ostial narrowing in the PDA-1 vessel, 70-80% at the ostium of the PDA -2 vessel   and ended in a small posterolateral system  Left ventriculography revealed hyperdynamic LV function.  Ejection fraction was 65-70%.  He was almost near mid cavity obliteration.  There was evidence of LVH.  There was 2+ MR.    Total contrast used: 80 cc Omnipaque  IMPRESSION:  Multi-vessel coronary obstructive disease with greater than 50% proximal to mid LAD stenoses, tandem 50% more distal LAD stenoses, and 70% first diagonal stenosis; 50% ostial left circumflex stenosis, followed by 50-60% stenosis in the proximal circumflex with 70% stenosis of a sharp bend in the OM1 vessel; 95% ostial RCA stenosis, not relieved with IC nitroglycerin, 20% mid stenosis and 50% ostial PDA1 and 70 - 80%  ostial PDA2 stenoses.  Hyperdynamic LV function with an ejection fraction of 65-70% with left hypertrophy and 2+ angiographic mitral regurgitation.   RECOMMENDATION:  The patient recently presented with mild troponin elevation c/w non-ST segment elevation myocardial infarction.  LV function is normal without wall motion abnormalities.  The culprit lesion, most likely is the 95% ostial RCA stenosis.  The patient does have renal insufficiency and diabetes mellitus.  Her creatinine has improved from 1.9-1.4 since admission.  She will be hydrated aggressively overnight.  Due to her renal function, she will tentatively be scheduled to attempt percutaneous cardiac intervention to the ostial RCA stenosis tomorrow following hydration.  Lennette Biharihomas A. Kiven Vangilder, MD, [facility] Center[facility] 06/10/2014 2:57 PM

## 2014-06-10 NOTE — Progress Notes (Signed)
ANTICOAGULATION CONSULT NOTE - Initial Consult  Pharmacy Consult for heparin Indication: chest pain/ACS  No Known Allergies  Patient Measurements: Height: _0  (160 cm) Weight: 160 lb 7.9 oz (72.8 kg) IBW/kg (Calculated) : 52.4 Heparin Dosing Weight: 67.7 kg  Vital Signs: Temp: 98.8 F (37.1 C) (07/27 0440) BP: 140/58 mmHg (07/27 1523) Pulse Rate: 58 (07/27 1523)  Labs:  Recent Labs  06/08/14 0046 06/08/14 0501 06/08/14 1130 06/09/14 0255 06/10/14 0400  HGB 10.1*  --   --  8.6* 9.3*  HCT 29.6*  --   --  25.0* 27.3*  PLT 210  --   --  171 169  CREATININE 1.53* 1.46*  --  1.44* 1.41*  TROPONINI 0.47* 0.49* 0.34*  --   --     Estimated Creatinine Clearance: 36 ml/min (by C-G formula based on Cr of 1.41).   Medical History: Past Medical History  Diagnosis Date  . Hypertension   . Diabetes mellitus without complication   . Hyperlipidemia     Medications:  Scheduled:  . aspirin EC  325 mg Oral Daily  . atorvastatin  80 mg Oral q1800  . carvedilol  25 mg Oral BID WC  . gabapentin  100 mg Oral TID  . insulin aspart  0-15 Units Subcutaneous TID WC  . insulin aspart  0-5 Units Subcutaneous QHS  . insulin aspart  0-9 Units Subcutaneous 6 times per day  . insulin aspart  4 Units Subcutaneous TID WC  . insulin detemir  15 Units Subcutaneous BID  . insulin starter kit- syringes  1 kit Other Once  . isosorbide mononitrate  60 mg Oral Daily  . living well with diabetes book   Does not apply Once  . pantoprazole  40 mg Oral Q1200  . sodium chloride  3 mL Intravenous Q12H   Infusions:  . sodium chloride      Assessment: 68 yo female with chest pain s/p cath will be started on heparin 8hrs post sheath removal. Per RN, sheath was removed at ~1520.   Goal of Therapy:  Heparin level 0.3-0.7 units/ml Monitor platelets by anticoagulation protocol: Yes   Plan:  - Start heparin at 850 units/hr at 2320. No bolus - 8hr heparin level - daily heparin level and  CBC  Yunique Dearcos, Tsz-Yin 06/10/2014,4:07 PM

## 2014-06-10 NOTE — Progress Notes (Signed)
Patient Name: Janice Love Date of Encounter: 06/10/2014     Principal Problem:   Chest pain Active Problems:   Benign essential HTN   Acute renal failure   Type II or unspecified type diabetes mellitus without mention of complication, uncontrolled   NSTEMI (non-ST elevated myocardial infarction)   HLD (hyperlipidemia)    SUBJECTIVE  No CP or SOB, awaiting cardiac cath.   CURRENT MEDS . aspirin EC  325 mg Oral Daily  . atorvastatin  40 mg Oral q1800  . carvedilol  25 mg Oral BID WC  . gabapentin  100 mg Oral TID  . heparin  5,000 Units Subcutaneous 3 times per day  . insulin aspart  0-15 Units Subcutaneous TID WC  . insulin aspart  0-5 Units Subcutaneous QHS  . insulin aspart  0-9 Units Subcutaneous 6 times per day  . insulin aspart  4 Units Subcutaneous TID WC  . insulin detemir  15 Units Subcutaneous BID  . isosorbide mononitrate  60 mg Oral Daily  . pantoprazole  40 mg Oral Q1200  . sodium chloride  3 mL Intravenous Q12H  . sodium chloride  3 mL Intravenous Q12H    OBJECTIVE  Filed Vitals:   06/09/14 1056 06/09/14 1408 06/09/14 2100 06/10/14 0440  BP: 157/60 158/58 150/56 139/57  Pulse: 90 76 81 74  Temp:  97.4 F (36.3 C) 100.9 F (38.3 C) 98.8 F (37.1 C)  TempSrc:  Oral    Resp:  18 15   Height:      Weight:      SpO2:  99%  98%    Intake/Output Summary (Last 24 hours) at 06/10/14 1121 Last data filed at 06/09/14 1742  Gross per 24 hour  Intake    240 ml  Output      0 ml  Net    240 ml   Filed Weights   06/07/14 2230 06/08/14 0457  Weight: 160 lb 8 oz (72.802 kg) 160 lb 7.9 oz (72.8 kg)    PHYSICAL EXAM  General: Pleasant, NAD. Neuro: Alert and oriented X 3. Moves all extremities spontaneously. Psych: Normal affect. HEENT:  Normal  Neck: Supple without bruits or JVD. Lungs:  Resp regular and unlabored, CTA. Heart: RRR no s3, s4, or murmurs. Abdomen: Soft, non-tender, non-distended, BS + x 4.  Extremities: No clubbing, cyanosis or  edema. DP/PT/Radials 2+ and equal bilaterally.  Accessory Clinical Findings  CBC  Recent Labs  06/08/14 0046 06/09/14 0255 06/10/14 0400  WBC 6.4 6.4 5.6  NEUTROABS 3.5  --   --   HGB 10.1* 8.6* 9.3*  HCT 29.6* 25.0* 27.3*  MCV 84.3 85.3 86.7  PLT 210 171 169   Basic Metabolic Panel  Recent Labs  06/08/14 0046  06/09/14 0255 06/10/14 0400  NA 137  < > 136* 136*  K 4.6  < > 4.0 3.8  CL 100  < > 103 103  CO2 23  < > 21 20  GLUCOSE 369*  < > 185* 177*  BUN 42*  < > 32* 22  CREATININE 1.53*  < > 1.44* 1.41*  CALCIUM 9.0  < > 8.6 8.4  MG 2.3  --   --   --   PHOS 3.5  --   --   --   < > = values in this interval not displayed. Liver Function Tests  Recent Labs  06/08/14 0046  AST 15  ALT 14  ALKPHOS 88  BILITOT 0.3  PROT 7.3  ALBUMIN 3.2*    Cardiac Enzymes  Recent Labs  06/08/14 0046 06/08/14 0501 06/08/14 1130  TROPONINI 0.47* 0.49* 0.34*    Hemoglobin A1C  Recent Labs  06/08/14 0046  HGBA1C 15.2*   Fasting Lipid Panel  Recent Labs  06/08/14 0500  CHOL 228*  HDL 39*  LDLCALC 147*  TRIG 211*  CHOLHDL 5.8   Thyroid Function Tests  Recent Labs  06/08/14 0046  TSH 1.170    TELE  NSR with some PVCs  Radiology/Studies  Nm Myocar Multi W/spect W/wall Motion / Ef  06/09/2014   CLINICAL DATA:  Chest pain, elevated troponin  EXAM: MYOCARDIAL IMAGING WITH SPECT (REST AND PHARMACOLOGIC-STRESS)  GATED LEFT VENTRICULAR WALL MOTION STUDY  LEFT VENTRICULAR EJECTION FRACTION  TECHNIQUE: Standard myocardial SPECT imaging was performed after resting intravenous injection of 10 mCi Tc-51m sestamibi. Subsequently, intravenous infusion of Lexiscan was performed under the supervision of the Cardiology staff. At peak effect of the drug, 30 mCi Tc-61m sestamibi was injected intravenously and standard myocardial SPECT imaging was performed. Quantitative gated imaging was also performed to evaluate left ventricular wall motion, and estimate left  ventricular ejection fraction.  COMPARISON:  None.  FINDINGS: SPECT: Fixed defect involving the apex and inferior lateral walls compatible with scar/ prior infarct. On all three views, the apical component of the defect appears slightly larger on the stress views compared to the rest imaging suspicious for a small amount of apical peri-infarct ischemia.  Wall motion:  Inferior lateral hypokinesis.  Ejection fraction:  Calculated ejection fraction is 63%.  IMPRESSION: Findings suspicious for a small amounts of peri-infarct inducible ischemia in the anterior apex.  63% ejection fraction     Dg Chest Port 1 View  06/08/2014   CLINICAL DATA:  Chest pain. Current history of hypertension, diabetes and hyperlipidemia.  EXAM: PORTABLE CHEST - 1 VIEW  COMPARISON:  None.  FINDINGS: Cardiac silhouette upper normal in size to perhaps minimally enlarged for the AP portable technique. Thoracic aorta mildly tortuous. Hilar and mediastinal contours otherwise unremarkable. Lungs clear. Bronchovascular markings normal. Pulmonary vascularity normal. No visible pleural effusions. No pneumothorax.  IMPRESSION: Borderline to mild cardiomegaly.  No acute cardiopulmonary disease.    2D ECHO: 06/08/2014 LV EF: 65% - 70% Study Conclusions - Left ventricle: The cavity size was normal. Wall thickness was increased in a pattern of moderate LVH. There was mild focal basal hypertrophy of the septum. Systolic function was vigorous. The estimated ejection fraction was in the range of 65% to 70%. Wall motion was normal; there were no regional wall motion abnormalities. Doppler parameters are consistent with abnormal left ventricular relaxation (grade 1 diastolic dysfunction). - Mitral valve: Calcified annulus. There was mild regurgitation. - Left atrium: The atrium was moderately dilated. Impressions: - Hyperdynamic LV function; intracavitary gradient of 3 m/s; grade 1 diastolic dysfunction; mild MR.    ASSESSMENT AND  PLAN  Janice Love is a 68 y.o. female with a history of newly diagnosed DM, HTN, hyperlipidemia, GERD, lifelong nonsmoker, family history of MI/heart disease in brother and no past cardiac hx who was transferred from [facility] on 06/07[facility]14 with complaints of chest pain and found to have NSTEMI.   NSTEMI- peak troponin 0.49, trending downwards. EKG with T wave inversion in lateral leads. Currently chest pain free. -- Initially hesitant to do cardiac cath due to her renal insufficiency, so underwent nuclear stress test with [facility] which showed reversable ischemia at the apex and normal EF. She was placed on IV hydration  and [facility]. 1.41 today --Continue aspirin, statin and beta blocker. Followup 2-D echo with EF 60-65%; hyperdynamic LV function; intracavitary gradient of 3 m/s; grade1 diastolic dysfunction; mild MR.  Uncontrolled type II DM: Hold metformin secondary to renal failure. A1c: 15.2.   Uncontrolled hypertension: Continue Coreg. Unable to start ACE inhibitor/ARB secondary to renal failure and may consider when renal failure improves. Imdur & Coreg dose increased.   Acute renal failure or chronic kidney disease: Baseline creatinine unknown. Continue IV fluid resuscitation and follow BMP. Creatinine has remained in 1.4 range which may be her baseline. FU BMP in am.   Hyperlipidemia: Started on statins.   GERD: Continue PPI   Anemia: Drop in HB likely dilutional over chronic.    Urban GibsonSigned, STERN, Emogene Muratalla PA-C  Pager [phone]

## 2014-06-10 NOTE — Progress Notes (Signed)
Patient Profile: 68 y/o female with no prior history of heart disease, but with history of HTN and DM admitted for evaluation of chest pain and elevated troponin.   Subjective:  currentlyCP free. Denies SOB and HA.   Objective: Vital signs in last 24 hours: Temp:  [97.4 F (36.3 C)-100.9 F (38.3 C)] 98.8 F (37.1 C) (07/27 0440) Pulse Rate:  [74-81] 74 (07/27 0440) Resp:  [15-18] 15 (07/26 2100) BP: (139-158)/(56-58) 139/57 mmHg (07/27 0440) SpO2:  [98 %-99 %] 98 % (07/27 0440) Last BM Date: 06/09/14  Intake/Output from previous day: 07/26 0701 - 07/27 0700 In: 240 [P.O.:240] Out: -  Intake/Output this shift:    Medications Current Facility-Administered Medications  Medication Dose Route Frequency Provider Last Rate Last Dose  . 0.9 %  sodium chloride infusion  250 mL Intravenous PRN Peter M Jordan, MD      . 0.9 %  sodium chloride infusion  1 mL/kg/hr Intravenous Continuous Peter M Jordan, MD 72.8 mL/hr at 06/09/14 2000 1 mL/kg/hr at 06/09/14 2000  . acetaminophen (TYLENOL) tablet 650 mg  650 mg Oral Q6H PRN Carlos Madera, MD   650 mg at 06/09/14 2306   Or  . acetaminophen (TYLENOL) suppository 650 mg  650 mg Rectal Q6H PRN Carlos Madera, MD      . aspirin EC tablet 325 mg  325 mg Oral Daily Carlos Madera, MD   325 mg at 06/10/14 1000  . atorvastatin (LIPITOR) tablet 40 mg  40 mg Oral q1800 Carlos Madera, MD   40 mg at 06/09/14 1658  . carvedilol (COREG) tablet 25 mg  25 mg Oral BID WC Peter M Jordan, MD   25 mg at 06/10/14 1000  . gabapentin (NEURONTIN) capsule 100 mg  100 mg Oral TID Carlos Madera, MD   100 mg at 06/10/14 1000  . heparin injection 5,000 Units  5,000 Units Subcutaneous 3 times per day Carlos Madera, MD   5,000 Units at 06/09/14 2307  . insulin aspart (novoLOG) injection 0-15 Units  0-15 Units Subcutaneous TID WC Anand D Hongalgi, MD      . insulin aspart (novoLOG) injection 0-5 Units  0-5 Units Subcutaneous QHS Anand D Hongalgi, MD      . insulin aspart  (novoLOG) injection 0-9 Units  0-9 Units Subcutaneous 6 times per day Anand D Hongalgi, MD      . insulin aspart (novoLOG) injection 4 Units  4 Units Subcutaneous TID WC Anand D Hongalgi, MD   4 Units at 06/09/14 1658  . insulin detemir (LEVEMIR) injection 15 Units  15 Units Subcutaneous BID Anand D Hongalgi, MD      . insulin starter kit- syringes (English) 1 kit  1 kit Other Once Anand D Hongalgi, MD      . isosorbide mononitrate (IMDUR) 24 hr tablet 60 mg  60 mg Oral Daily Peter M Jordan, MD   60 mg at 06/10/14 1000  . living well with diabetes book MISC   Does not apply Once Anand D Hongalgi, MD      . morphine 2 MG/ML injection 1 mg  1 mg Intravenous Q3H PRN Carlos Madera, MD      . ondansetron (ZOFRAN) tablet 4 mg  4 mg Oral Q6H PRN Carlos Madera, MD       Or  . ondansetron (ZOFRAN) injection 4 mg  4 mg Intravenous Q6H PRN Carlos Madera, MD      . oxyCODONE (Oxy IR/ROXICODONE) immediate release tablet 5 mg  5 mg Oral   Q4H PRN Carlos Madera, MD      . pantoprazole (PROTONIX) EC tablet 40 mg  40 mg Oral Q1200 Carlos Madera, MD   40 mg at 06/10/14 1000  . sodium chloride 0.9 % injection 3 mL  3 mL Intravenous Q12H Carlos Madera, MD   3 mL at 06/08/14 2315  . sodium chloride 0.9 % injection 3 mL  3 mL Intravenous Q12H Peter M Jordan, MD      . sodium chloride 0.9 % injection 3 mL  3 mL Intravenous PRN Peter M Jordan, MD        PE: General appearance: alert, cooperative and no distress Lungs: clear to auscultation bilaterally Heart: regular rate and rhythm and 2/6 SM loudest at the apex Extremities: no LEE Pulses: 2+ and symmetric Skin: warm and dry Neurologic: Grossly normal 2+ right radial pulse  Lab Results:   Recent Labs  06/08/14 0046 06/09/14 0255 06/10/14 0400  WBC 6.4 6.4 5.6  HGB 10.1* 8.6* 9.3*  HCT 29.6* 25.0* 27.3*  PLT 210 171 169   BMET  Recent Labs  06/08/14 0501 06/09/14 0255 06/10/14 0400  NA 138 136* 136*  K 4.4 4.0 3.8  CL 102 103 103  CO2 24 21 20    GLUCOSE 294* 185* 177*  BUN 39* 32* 22  CREATININE 1.46* 1.44* 1.41*  CALCIUM 9.0 8.6 8.4   PT/INR No results found for this basename: LABPROT, INR,  in the last 72 hours Cholesterol  Recent Labs  06/08/14 0500  CHOL 228*   Cardiac Panel (last 3 results)  Recent Labs  06/08/14 0046 06/08/14 0501 06/08/14 1130  TROPONINI 0.47* 0.49* 0.34*    Studies: Lexiscan NST - pending  Assessment/Plan   Principal Problem:   Chest pain Active Problems:   Benign essential HTN   Acute renal failure   Type II or unspecified type diabetes mellitus without mention of complication, uncontrolled   NSTEMI (non-ST elevated myocardial infarction)   HLD (hyperlipidemia)  1. NSTEMI: currently CP free. Planning for cardiac cath today. Continue ASA, BB and statin.  All questions answered.  Question prior infarct on the nuclear scan with mild peri-infarct ischemia.  2. HTN: BP is intermittently severely elevated.   Continue Coreg and Imdur.  3. Acute Renal Failure: Scr only slightly improved from 1.46 to 1.41.   LOS: 3 days     VARANASI,JAYADEEP S., MDFACC 06/10/2014 12:36 PM    

## 2014-06-11 ENCOUNTER — Encounter (HOSPITAL_COMMUNITY)
Admission: EM | Disposition: A | Discharge status: Home / Self Care | Payer: Medicare Other | Source: Other Acute Inpatient Hospital | Attending: Internal Medicine

## 2014-06-11 DIAGNOSIS — I2510 Atherosclerotic heart disease of native coronary artery without angina pectoris: Secondary | ICD-10-CM

## 2014-06-11 HISTORY — PX: PERCUTANEOUS CORONARY STENT INTERVENTION (PCI-S): SHX5485

## 2014-06-11 HISTORY — PX: CORONARY ANGIOPLASTY WITH STENT PLACEMENT: SHX49

## 2014-06-11 LAB — GLUCOSE, CAPILLARY
GLUCOSE-CAPILLARY: 183 mg/dL — AB (ref 70–99)
GLUCOSE-CAPILLARY: 136 mg/dL — AB (ref 70–99)
Glucose-Capillary: 109 mg/dL — ABNORMAL HIGH (ref 70–99)
Glucose-Capillary: 80 mg/dL (ref 70–99)
Glucose-Capillary: 183 mg/dL — ABNORMAL HIGH (ref 70–99)

## 2014-06-11 LAB — CBC
HCT: 26.3 % — ABNORMAL LOW (ref 36.0–46.0)
HEMOGLOBIN: 8.8 g/dL — AB (ref 12.0–15.0)
MCH: 29.4 pg (ref 26.0–34.0)
MCHC: 33.5 g/dL (ref 30.0–36.0)
MCV: 88 fL (ref 78.0–100.0)
Platelets: 149 10*3/uL — ABNORMAL LOW (ref 150–400)
RBC: 2.99 MIL/uL — AB (ref 3.87–5.11)
RDW: 13.1 % (ref 11.5–15.5)
WBC: 5.1 10*3/uL (ref 4.0–10.5)

## 2014-06-11 LAB — PROTIME-INR
INR: 1.09 (ref 0.00–1.49)
Prothrombin Time: 14.1 seconds (ref 11.6–15.2)

## 2014-06-11 LAB — BASIC METABOLIC PANEL
Anion gap: 13 (ref 5–15)
BUN: 16 mg/dL (ref 6–23)
CO2: 19 mEq/L (ref 19–32)
Calcium: 8.3 mg/dL — ABNORMAL LOW (ref 8.4–10.5)
Chloride: 108 mEq/L (ref 96–112)
Creatinine, Ser: 1.2 mg/dL — ABNORMAL HIGH (ref 0.50–1.10)
GFR calc Af Amer: 52 mL/min — ABNORMAL LOW (ref >90)
GFR calc non Af Amer: 45 mL/min — ABNORMAL LOW (ref >90)
Glucose, Bld: 187 mg/dL — ABNORMAL HIGH (ref 70–99)
Potassium: 4.3 mEq/L (ref 3.7–5.3)
Sodium: 140 mEq/L (ref 137–147)

## 2014-06-11 LAB — POCT ACTIVATED CLOTTING TIME: ACTIVATED CLOTTING TIME: 298 s

## 2014-06-11 LAB — HEPARIN LEVEL (UNFRACTIONATED): Heparin Unfractionated: 0.62 IU/mL (ref 0.30–0.70)

## 2014-06-11 SURGERY — PERCUTANEOUS CORONARY STENT INTERVENTION (PCI-S)
Anesthesia: LOCAL

## 2014-06-11 MED ORDER — PRASUGREL HCL 10 MG PO TABS
10.0000 mg | ORAL_TABLET | Freq: Every day | ORAL | Status: DC
  Administered 2014-06-11 – 2014-06-13 (×3): 10 mg via ORAL
  Filled 2014-06-11 (×3): qty 1

## 2014-06-11 MED ORDER — ATORVASTATIN CALCIUM 80 MG PO TABS
80.0000 mg | ORAL_TABLET | Freq: Every day | ORAL | Status: DC
  Administered 2014-06-11: 18:00:00 80 mg via ORAL
  Filled 2014-06-11 (×2): qty 1

## 2014-06-11 MED ORDER — SODIUM CHLORIDE 0.9 % IJ SOLN: 3.0000 mL | INTRAMUSCULAR | Status: DC | PRN

## 2014-06-11 MED ORDER — ASPIRIN 81 MG PO CHEW
81.0000 mg | CHEWABLE_TABLET | ORAL | Status: DC
  Administered 2014-06-11: 11:00:00 81 mg via ORAL

## 2014-06-11 MED ORDER — PRASUGREL HCL 10 MG PO TABS
ORAL_TABLET | ORAL | Status: AC
  Filled 2014-06-11: qty 5

## 2014-06-11 MED ORDER — MIDAZOLAM HCL 2 MG/2ML IJ SOLN
INTRAMUSCULAR | Status: AC
  Filled 2014-06-11: qty 2

## 2014-06-11 MED ORDER — ATROPINE SULFATE 0.1 MG/ML IJ SOLN
INTRAMUSCULAR | Status: AC
  Administered 2014-06-11: 19:00:00 0.5 mg
  Filled 2014-06-11: qty 10

## 2014-06-11 MED ORDER — SODIUM CHLORIDE 0.9 % IV SOLN
0.2500 mg/kg/h | INTRAVENOUS | Status: AC
  Filled 2014-06-11: qty 250

## 2014-06-11 MED ORDER — HEPARIN (PORCINE) IN NACL 2-0.9 UNIT/ML-% IJ SOLN
INTRAMUSCULAR | Status: AC
  Filled 2014-06-11: qty 1000

## 2014-06-11 MED ORDER — SODIUM CHLORIDE 0.9 % IV SOLN
INTRAVENOUS | Status: DC
  Administered 2014-06-12: 02:00:00 75 mL/h via INTRAVENOUS

## 2014-06-11 MED ORDER — HYDRALAZINE HCL 20 MG/ML IJ SOLN
10.0000 mg | Freq: Once | INTRAMUSCULAR | Status: AC
  Administered 2014-06-11: 10 mg via INTRAVENOUS
  Filled 2014-06-11: qty 1

## 2014-06-11 MED ORDER — SODIUM CHLORIDE 0.9 % IV SOLN
0.2500 mg/kg/h | INTRAVENOUS | Status: DC
  Filled 2014-06-11: qty 250

## 2014-06-11 MED ORDER — ONDANSETRON HCL 4 MG/2ML IJ SOLN: 4.0000 mg | Freq: Four times a day (QID) | INTRAMUSCULAR | Status: DC | PRN

## 2014-06-11 MED ORDER — SODIUM CHLORIDE 0.9 % IV SOLN: INTRAVENOUS | Status: DC

## 2014-06-11 MED ORDER — BIVALIRUDIN 250 MG IV SOLR
INTRAVENOUS | Status: AC
Start: 2014-06-11 — End: 2014-06-11
  Filled 2014-06-11: qty 250

## 2014-06-11 MED ORDER — NITROGLYCERIN 0.2 MG/ML ON CALL CATH LAB
INTRAVENOUS | Status: AC
  Filled 2014-06-11: qty 1

## 2014-06-11 MED ORDER — SODIUM CHLORIDE 0.9 % IV SOLN: 250.0000 mL | INTRAVENOUS | Status: DC | PRN

## 2014-06-11 MED ORDER — INSULIN DETEMIR 100 UNIT/ML ~~LOC~~ SOLN
8.0000 [IU] | Freq: Once | SUBCUTANEOUS | Status: AC
  Administered 2014-06-11: 09:00:00 8 [IU] via SUBCUTANEOUS
  Filled 2014-06-11: qty 0.08

## 2014-06-11 MED ORDER — ASPIRIN 81 MG PO CHEW
CHEWABLE_TABLET | ORAL | Status: AC
  Administered 2014-06-11: 11:00:00 81 mg
  Filled 2014-06-11: qty 1

## 2014-06-11 MED ORDER — INSULIN ASPART 100 UNIT/ML ~~LOC~~ SOLN
0.0000 [IU] | SUBCUTANEOUS | Status: DC
  Administered 2014-06-11: 11:00:00 2 [IU] via SUBCUTANEOUS
  Administered 2014-06-11: 20:00:00 1 [IU] via SUBCUTANEOUS
  Administered 2014-06-12: 2 [IU] via SUBCUTANEOUS
  Administered 2014-06-12: 04:00:00 3 [IU] via SUBCUTANEOUS
  Administered 2014-06-12: 1 [IU] via SUBCUTANEOUS
  Administered 2014-06-12: 3 [IU] via SUBCUTANEOUS

## 2014-06-11 MED ORDER — INSULIN DETEMIR 100 UNIT/ML ~~LOC~~ SOLN
15.0000 [IU] | Freq: Two times a day (BID) | SUBCUTANEOUS | Status: DC
  Administered 2014-06-11: 15 [IU] via SUBCUTANEOUS
  Filled 2014-06-11 (×3): qty 0.15

## 2014-06-11 MED ORDER — ASPIRIN EC 81 MG PO TBEC
81.0000 mg | DELAYED_RELEASE_TABLET | Freq: Every day | ORAL | Status: DC
  Administered 2014-06-12 – 2014-06-13 (×2): 81 mg via ORAL
  Filled 2014-06-11 (×3): qty 1

## 2014-06-11 MED ORDER — SODIUM CHLORIDE 0.9 % IJ SOLN: 3.0000 mL | Freq: Two times a day (BID) | INTRAMUSCULAR | Status: DC

## 2014-06-11 MED ORDER — FENTANYL CITRATE 0.05 MG/ML IJ SOLN
INTRAMUSCULAR | Status: AC
  Filled 2014-06-11: qty 2

## 2014-06-11 MED ORDER — LIDOCAINE HCL (PF) 1 % IJ SOLN
INTRAMUSCULAR | Status: AC
  Filled 2014-06-11: qty 30

## 2014-06-11 MED ORDER — ACETAMINOPHEN 325 MG PO TABS: 650.0000 mg | ORAL_TABLET | ORAL | Status: DC | PRN

## 2014-06-11 MED ORDER — PRASUGREL HCL 10 MG PO TABS
ORAL_TABLET | ORAL | Status: AC
  Filled 2014-06-11: qty 1

## 2014-06-11 NOTE — H&P (View-Only) (Signed)
Patient Profile:  67 y/o female with no prior history of heart disease, but with history of HTN and DM admitted for evaluation of chest pain and elevated troponin.  95% RCA stenosis- for PCI today if Cr. allows   Subjective: No chest pain, no SOB  Objective: Vital signs in last 24 hours: Temp:  [97.6 F (36.4 C)-99.1 F (37.3 C)] 98.9 F (37.2 C) (07/28 0756) Pulse Rate:  [54-72] 72 (07/28 0756) Resp:  [12-19] 18 (07/28 0756) BP: (136-185)/(49-122) 170/66 mmHg (07/28 0756) SpO2:  [96 %-100 %] 99 % (07/28 0756) Weight:  [161 lb 2.5 oz (73.1 kg)] 161 lb 2.5 oz (73.1 kg) (07/28 0004) Weight change:  Last BM Date: 06/10/14 Intake/Output from previous day: +855  07/27 0701 - 07/28 0700 In: 2519.7 [P.O.:460; I.V.:2059.7] Out: 1400 [Urine:1400] Intake/Output this shift:    PE: General:Pleasant affect, NAD Skin:Warm and dry, brisk capillary refill HEENT:normocephalic, sclera clear, mucus membranes moist Neck:supple, no JVD, no bruits  Heart:S1S2 RRR with 2-3/6 systolic murmur, no gallup, rub or click Lungs:clear without rales, rhonchi, or wheezes IHK:VQQV, non tender, + BS, do not palpate liver spleen or masses Ext:no lower ext edema, 2+ pedal pulses, 2+ radial pulses, rt groin cath site with ecchymosis no to small hematoma Neuro:alert and oriented X 3, MAE, follows commands, + facial symmetry   Lab Results:  Recent Labs  06/09/14 0255 06/10/14 0400  WBC 6.4 5.6  HGB 8.6* 9.3*  HCT 25.0* 27.3*  PLT 171 169   BMET  Recent Labs  06/09/14 0255 06/10/14 0400  NA 136* 136*  K 4.0 3.8  CL 103 103  CO2 21 20  GLUCOSE 185* 177*  BUN 32* 22  CREATININE 1.44* 1.41*  CALCIUM 8.6 8.4    Recent Labs  06/08/14 1130  TROPONINI 0.34*    Lab Results  Component Value Date   CHOL 228* 06/08/2014   HDL 39* 06/08/2014   LDLCALC 147* 06/08/2014   TRIG 211* 06/08/2014   CHOLHDL 5.8 06/08/2014   Lab Results  Component Value Date   HGBA1C 15.2* 06/08/2014       Lab Results  Component Value Date   TSH 1.170 06/08/2014      Studies/Results: Nm Myocar Multi W/spect W/wall Motion / Ef  06/09/2014   CLINICAL DATA:  Chest pain, elevated troponin  EXAM: MYOCARDIAL IMAGING WITH SPECT (REST AND PHARMACOLOGIC-STRESS)  GATED LEFT VENTRICULAR WALL MOTION STUDY  LEFT VENTRICULAR EJECTION FRACTION  TECHNIQUE: Standard myocardial SPECT imaging was performed after resting intravenous injection of 10 mCi Tc-48m sestamibi. Subsequently, intravenous infusion of Lexiscan was performed under the supervision of the Cardiology staff. At peak effect of the drug, 30 mCi Tc-82m sestamibi was injected intravenously and standard myocardial SPECT imaging was performed. Quantitative gated imaging was also performed to evaluate left ventricular wall motion, and estimate left ventricular ejection fraction.  COMPARISON:  None.  FINDINGS: SPECT: Fixed defect involving the apex and inferior lateral walls compatible with scar/ prior infarct. On all three views, the apical component of the defect appears slightly larger on the stress views compared to the rest imaging suspicious for a small amount of apical peri-infarct ischemia.  Wall motion:  Inferior lateral hypokinesis.  Ejection fraction:  Calculated ejection fraction is 63%.  IMPRESSION: Findings suspicious for a small amounts of peri-infarct inducible ischemia in the anterior apex.  63% ejection fraction   Electronically Signed   By: Ruel Favors M.D.   On: 06/09/2014 12:27   Cardiac  cath: Multi-vessel coronary obstructive disease with greater than 50% proximal to mid LAD stenoses, tandem 50% more distal LAD stenoses, and 70% first diagonal stenosis; 50% ostial left circumflex stenosis, followed by 50-60% stenosis in the proximal circumflex with 70% stenosis of a sharp bend in the OM1 vessel; 95% ostial RCA stenosis, not relieved with IC nitroglycerin, 20% mid stenosis and 50% ostial PDA1 and 70 - 80% ostial PDA2 stenoses.  Hyperdynamic  LV function with an ejection fraction of 65-70% with left hypertrophy and 2+ angiographic mitral regurgitation. The culprit lesion, most likely is the 95% ostial RCA stenosis. The patient does have renal insufficiency and diabetes mellitus. Her creatinine has improved from 1.9-1.4 since admission. She will be hydrated aggressively overnight. Due to her renal function, she will tentatively be scheduled to attempt percutaneous cardiac intervention to the ostial RCA stenosis today.     Medications: I have reviewed the patient's current medications. Scheduled Meds: . aspirin EC  325 mg Oral Daily  . atorvastatin  80 mg Oral q1800  . carvedilol  25 mg Oral BID WC  . gabapentin  100 mg Oral TID  . insulin aspart  0-15 Units Subcutaneous TID WC  . insulin aspart  0-5 Units Subcutaneous QHS  . insulin aspart  4 Units Subcutaneous TID WC  . insulin detemir  15 Units Subcutaneous BID  . isosorbide mononitrate  60 mg Oral Daily  . pantoprazole  40 mg Oral Q1200  . sodium chloride  3 mL Intravenous Q12H   Continuous Infusions: . sodium chloride 100 mL/hr at 06/10/14 2254  . heparin 850 Units/hr (06/10/14 2323)   PRN Meds:.acetaminophen, acetaminophen, morphine injection, ondansetron (ZOFRAN) IV, ondansetron, oxyCODONE  Assessment/Plan: Principal Problem:   NSTEMI (non-ST elevated myocardial infarction) Active Problems:   Chest pain   Benign essential HTN- elevated BP may add norvasc for improved control   Acute renal failure--Cr. Pending   Type II or unspecified type diabetes mellitus without mention of complication, uncontrolled- levemir decreased for this AM will need to increase if cath cancelled.   HLD (hyperlipidemia)-lipitor increased from 20 mg outpt to 80 mg   Anemia H/H 9.3/27.3--admit 10.1   LOS: 4 days   Time spent with pt. :15 minutes. [facility] LLCNGOLD,LAURA R  Nurse Practitioner Certified Pager [phone] or after 5pm and on weekends call [phone] 06/11/2014, 8:11 AM    Patient seen  and examined. Agree with assessment and plan. No recurrent chest pain. Discussed PCI of RCA ostium and reviewed other cath findings. Consent obtained. Cr 1.2 today.   Lennette Biharihomas A. Kashira Behunin, MD, [facility] CenterFACC 06/11/2014 11:02 AM

## 2014-06-11 NOTE — Progress Notes (Signed)
Site area: right groin  Site Prior to Removal:  Level 1  Pressure Applied For 20 MINUTES    Minutes Beginning at 1835+  Manual:   Yes.    Patient Status During Pull:  comment  Post Pull Groin Site:  Level 1  Post Pull Instructions Given:  Yes.    Post Pull Pulses Present:  Yes.    Dressing Applied:  Yes.    Comments:

## 2014-06-11 NOTE — Progress Notes (Signed)
ANTICOAGULATION CONSULT NOTE - Initial Consult  Pharmacy Consult for heparin Indication: chest pain/ACS  No Known Allergies  Patient Measurements: Height: 5\' 3"  (160 cm) Weight: 161 lb 2.5 oz (73.1 kg) IBW/kg (Calculated) : 52.4 Heparin Dosing Weight: 67.7 kg  Vital Signs: Temp: 98.9 F (37.2 C) (07/28 0756) Temp src: Oral (07/28 0756) BP: 170/66 mmHg (07/28 0756) Pulse Rate: 72 (07/28 0756)  Labs:  Recent Labs  06/08/14 1130  06/09/14 0255 06/10/14 0400 06/11/14 0515 06/11/14 0840  HGB  --   < > 8.6* 9.3*  --  8.8*  HCT  --   --  25.0* 27.3*  --  26.3*  PLT  --   --  171 169  --  149*  LABPROT  --   --   --   --  14.1  --   INR  --   --   --   --  1.09  --   HEPARINUNFRC  --   --   --   --   --  0.62  CREATININE  --   --  1.44* 1.41*  --  1.20*  TROPONINI 0.34*  --   --   --   --   --   < > = values in this interval not displayed.  Estimated Creatinine Clearance: 42.4 ml/min (by C-G formula based on Cr of 1.2).  Assessment: 68 yo female with chest pain s/p cath, heparin restarted post cath plan for repeating cath today after rehydration. Heparin level (0.62) is therapeutic on 850 units/hr. Hgb/pltc slighly dropped post cath, will monitor.  Goal of Therapy:  Heparin level 0.3-0.7 units/ml Monitor platelets by anticoagulation protocol: Yes   Plan:  - Continue heparin gtt 850 units/hr - F/u Plans after repeat cath - Daily heparin level and CBC  Bayard HuggerMei Rosibel Giacobbe, PharmD, BCPS  Clinical Pharmacist  Pager: [phone]   06/11/2014,10:38 AM

## 2014-06-11 NOTE — Interval H&P Note (Signed)
Cath Lab Visit (complete for each Cath Lab visit)  Clinical Evaluation Leading to the Procedure:   ACS: Yes.    Non-ACS:    Anginal Classification: CCS III  Anti-ischemic medical therapy: Maximal Therapy (2 or more classes of medications)  Non-Invasive Test Results: High-risk stress test findings: cardiac mortality >3%/year  Prior CABG: No previous CABG      History and Physical Interval Note:  06/11/2014 12:01 PM  Janice Love  has presented today for surgery, with the diagnosis of known cad  The various methods of treatment have been discussed with the patient and family. After consideration of risks, benefits and other options for treatment, the patient has consented to  Procedure(s): PERCUTANEOUS CORONARY STENT INTERVENTION (PCI-S) (N/A) as a surgical intervention .  The patient's history has been reviewed, patient examined, no change in status, stable for surgery.  I have reviewed the patient's chart and labs.  Questions were answered to the patient's satisfaction.     KELLY,THOMAS A

## 2014-06-11 NOTE — Progress Notes (Signed)
Inpatient Diabetes Program Recommendations  AACE/ADA: New Consensus Statement on Inpatient Glycemic Control (2013)  Target Ranges:  Prepandial:   less than 140 mg/dL      Peak postprandial:   less than 180 mg/dL (1-2 hours)      Critically ill patients:  140 - 180 mg/dL   Reason for Visit: diabetes teaching  Diabetes history: Type 2 Outpatient Diabetes medications: Metformin 500mg  bid Instructed patient on insulin types (Levemir/Lantus/rapid acting insulin), site selection, storage.  Had patient teach back what she learned from RN last night- correct demonstration.  I also reviewed the insulin pen needle in case the patient is discharge on an insulin pen- was able to successfully demonstrate the use of the pen.   Reviewed long term complications of diabetes, hyper and hypoglycemia signs and treatment, general meal planning guidelines and the need for carbs and proteins at all meals and as part of the bedtime snack.   Patient will need an order for outpatient diabetes education, insulin syringes or pen needles, and glucometer strips.  Susette RacerJulie Marketa Midkiff, RN, BA, MHA, CDE Diabetes Coordinator Inpatient Diabetes Program  [phone] (Team Pager) [phone] Patrcia Dolly([facility] Office) 06/11/2014 9:55 AM

## 2014-06-11 NOTE — Progress Notes (Signed)
Lengthy discussion with patient and family about heart healthy/diabetic diet.  Patient has eagerly watched videos about diabetes, MI, PCI.  Numerous questions answered.  Teaching provided about s/s hypo/hyperglycemia, range of normal blood sugar.  Taught pt how to give insulin injection, pt demonstrated adequately after teaching by administering injection to herself.  Insulin starter kit, "AHA Risk factors" and "Bouncing back from heart attack" books given.  Rt groin and Rt radial sites level 0.

## 2014-06-11 NOTE — Progress Notes (Signed)
Patient Profile:  67 y/o female with no prior history of heart disease, but with history of HTN and DM admitted for evaluation of chest pain and elevated troponin.  95% RCA stenosis- for PCI today if Cr. allows   Subjective: No chest pain, no SOB  Objective: Vital signs in last 24 hours: Temp:  [97.6 F (36.4 C)-99.1 F (37.3 C)] 98.9 F (37.2 C) (07/28 0756) Pulse Rate:  [54-72] 72 (07/28 0756) Resp:  [12-19] 18 (07/28 0756) BP: (136-185)/(49-122) 170/66 mmHg (07/28 0756) SpO2:  [96 %-100 %] 99 % (07/28 0756) Weight:  [161 lb 2.5 oz (73.1 kg)] 161 lb 2.5 oz (73.1 kg) (07/28 0004) Weight change:  Last BM Date: 06/10/14 Intake/Output from previous day: +855  07/27 0701 - 07/28 0700 In: 2519.7 [P.O.:460; I.V.:2059.7] Out: 1400 [Urine:1400] Intake/Output this shift:    PE: General:Pleasant affect, NAD Skin:Warm and dry, brisk capillary refill HEENT:normocephalic, sclera clear, mucus membranes moist Neck:supple, no JVD, no bruits  Heart:S1S2 RRR with 2-3/6 systolic murmur, no gallup, rub or click Lungs:clear without rales, rhonchi, or wheezes IHK:VQQV, non tender, + BS, do not palpate liver spleen or masses Ext:no lower ext edema, 2+ pedal pulses, 2+ radial pulses, rt groin cath site with ecchymosis no to small hematoma Neuro:alert and oriented X 3, MAE, follows commands, + facial symmetry   Lab Results:  Recent Labs  06/09/14 0255 06/10/14 0400  WBC 6.4 5.6  HGB 8.6* 9.3*  HCT 25.0* 27.3*  PLT 171 169   BMET  Recent Labs  06/09/14 0255 06/10/14 0400  NA 136* 136*  K 4.0 3.8  CL 103 103  CO2 21 20  GLUCOSE 185* 177*  BUN 32* 22  CREATININE 1.44* 1.41*  CALCIUM 8.6 8.4    Recent Labs  06/08/14 1130  TROPONINI 0.34*    Lab Results  Component Value Date   CHOL 228* 06/08/2014   HDL 39* 06/08/2014   LDLCALC 147* 06/08/2014   TRIG 211* 06/08/2014   CHOLHDL 5.8 06/08/2014   Lab Results  Component Value Date   HGBA1C 15.2* 06/08/2014       Lab Results  Component Value Date   TSH 1.170 06/08/2014      Studies/Results: Nm Myocar Multi W/spect W/wall Motion / Ef  06/09/2014   CLINICAL DATA:  Chest pain, elevated troponin  EXAM: MYOCARDIAL IMAGING WITH SPECT (REST AND PHARMACOLOGIC-STRESS)  GATED LEFT VENTRICULAR WALL MOTION STUDY  LEFT VENTRICULAR EJECTION FRACTION  TECHNIQUE: Standard myocardial SPECT imaging was performed after resting intravenous injection of 10 mCi Tc-48m sestamibi. Subsequently, intravenous infusion of Lexiscan was performed under the supervision of the Cardiology staff. At peak effect of the drug, 30 mCi Tc-82m sestamibi was injected intravenously and standard myocardial SPECT imaging was performed. Quantitative gated imaging was also performed to evaluate left ventricular wall motion, and estimate left ventricular ejection fraction.  COMPARISON:  None.  FINDINGS: SPECT: Fixed defect involving the apex and inferior lateral walls compatible with scar/ prior infarct. On all three views, the apical component of the defect appears slightly larger on the stress views compared to the rest imaging suspicious for a small amount of apical peri-infarct ischemia.  Wall motion:  Inferior lateral hypokinesis.  Ejection fraction:  Calculated ejection fraction is 63%.  IMPRESSION: Findings suspicious for a small amounts of peri-infarct inducible ischemia in the anterior apex.  63% ejection fraction   Electronically Signed   By: Ruel Favors M.D.   On: 06/09/2014 12:27   Cardiac  cath: Multi-vessel coronary obstructive disease with greater than 50% proximal to mid LAD stenoses, tandem 50% more distal LAD stenoses, and 70% first diagonal stenosis; 50% ostial left circumflex stenosis, followed by 50-60% stenosis in the proximal circumflex with 70% stenosis of a sharp bend in the OM1 vessel; 95% ostial RCA stenosis, not relieved with IC nitroglycerin, 20% mid stenosis and 50% ostial PDA1 and 70 - 80% ostial PDA2 stenoses.  Hyperdynamic  LV function with an ejection fraction of 65-70% with left hypertrophy and 2+ angiographic mitral regurgitation. The culprit lesion, most likely is the 95% ostial RCA stenosis. The patient does have renal insufficiency and diabetes mellitus. Her creatinine has improved from 1.9-1.4 since admission. She will be hydrated aggressively overnight. Due to her renal function, she will tentatively be scheduled to attempt percutaneous cardiac intervention to the ostial RCA stenosis today.     Medications: I have reviewed the patient's current medications. Scheduled Meds: . aspirin EC  325 mg Oral Daily  . atorvastatin  80 mg Oral q1800  . carvedilol  25 mg Oral BID WC  . gabapentin  100 mg Oral TID  . insulin aspart  0-15 Units Subcutaneous TID WC  . insulin aspart  0-5 Units Subcutaneous QHS  . insulin aspart  4 Units Subcutaneous TID WC  . insulin detemir  15 Units Subcutaneous BID  . isosorbide mononitrate  60 mg Oral Daily  . pantoprazole  40 mg Oral Q1200  . sodium chloride  3 mL Intravenous Q12H   Continuous Infusions: . sodium chloride 100 mL/hr at 06/10/14 2254  . heparin 850 Units/hr (06/10/14 2323)   PRN Meds:.acetaminophen, acetaminophen, morphine injection, ondansetron (ZOFRAN) IV, ondansetron, oxyCODONE  Assessment/Plan: Principal Problem:   NSTEMI (non-ST elevated myocardial infarction) Active Problems:   Chest pain   Benign essential HTN- elevated BP may add norvasc for improved control   Acute renal failure--Cr. Pending   Type II or unspecified type diabetes mellitus without mention of complication, uncontrolled- levemir decreased for this AM will need to increase if cath cancelled.   HLD (hyperlipidemia)-lipitor increased from 20 mg outpt to 80 mg   Anemia H/H 9.3/27.3--admit 10.1   LOS: 4 days   Time spent with pt. :15 minutes. [facility] LLCNGOLD,LAURA R  Nurse Practitioner Certified Pager [phone] or after 5pm and on weekends call [phone] 06/11/2014, 8:11 AM    Patient seen  and examined. Agree with assessment and plan. No recurrent chest pain. Discussed PCI of RCA ostium and reviewed other cath findings. Consent obtained. Cr 1.2 today.   Lennette Biharihomas A. Kelly, MD, [facility] CenterFACC 06/11/2014 11:02 AM

## 2014-06-11 NOTE — Progress Notes (Signed)
PROGRESS NOTE    Janice Love ONG:[id number] DOB: 1946/06/14 DOA: 06/07/2014 PCP: No primary provider on file.  HPI/Brief narrative 68 year old female patient with history of recently diagnosed type II DM, hypertension, hyperlipidemia and GERD, lifelong nonsmoker, family history of MI/heart disease in brother, transferred from [facility] hospital with complaints of chest pain. She was hospitalized approximately 3 months ago at [facility] and told to have diabetes and hypertension. She was discharged on oral medications. She was reassessed a week ago with her PCP who stated that her blood pressure was still not adequately controlled. While at work on 06/07/14, she started experiencing upper mid/left-sided chest pain, radiating to her neck, no true relationship to exertion. She left work and went home. She felt dizzy and nauseous. She also had polyuria and weakness. At the outside ED, her blood sugars are apparently in the 600 mg per DL range, acute renal failure-baseline creatinine unknown and mildly elevated troponin. She was transferred to [facility] due to lack of cardiology coverage over the weekend. Cardiology consulted. Patient ruled in for MI and is undergoing cath and possible PCI after renal failure had improved with IV hydration. Patient placed on insulin for diabetes control.   Assessment/Plan:  1. NSTEMI: Peak troponin of 0.49. EKG with T wave inversion in lateral leads. Currently chest pain free. CAD risk factors-type II DM, HTN, HLD and family history of CAD. Cardiology consulted and given current renal insufficiency, were hesitant to do cardiac cath which may make her renal failure worse. Thereby, nuclear stress test with Lexi scan Myoview performed which showed reversable ischemia at the apex and normal EF. Continue aspirin, statin and beta blocker. 2-D echo: LVEF 65-70% with grade 1 diastolic dysfunction. Cath on 7/27 Showed 95% RCA Stenosis-Plan for PCI on 7/28 since Creatinine  Improved. 2. Uncontrolled type II DM: Held metformin secondary to renal failure.  A1c: 15.2. Started on Levemir and SSI. Monitor closely. Titrate insulins and will DC with Insulins. Diabetes education. Will give half dose of Levemir this morning and change sliding scale to sensitive q. 4-patient n.p.o. for cardiac cath at 3 PM. Resume usual dose post cath. Improved control. 3. Uncontrolled hypertension: Continue Coreg. Unable to start ACE inhibitor/ARB secondary to renal failure and may consider when renal failure improves. Imdur & Coreg dose increased. Blood pressure controlled. 4. Acute renal failure or chronic kidney disease stage III: Baseline creatinine unknown. Continue IV fluid resuscitation and follow BMP. Creatinine has slowly decreased from 1.53 on 7/25 > 1.2 on 7/28 5. Hyperlipidemia: Started on statins. 6. GERD: Continue PPI 7. Anemia of chronic disease: Stable over last 72 hours. Anemia panel reviewed-suggestive of chronic disease.   Code Status: Full Family Communication: None at bedside. Disposition Plan: Home when medically stable   Consultants:  Cardiology  Procedures:  Cath on 7/27  Antibiotics:  None   Subjective: Denies complaints. Denies chest pain.  Objective: Filed Vitals:   06/11/14 1740 06/11/14 1745 06/11/14 1800 06/11/14 1815  BP: 165/75 165/147 151/58 136/50  Pulse:  59 51 55  Temp:      TempSrc:      Resp:      Height:      Weight:      SpO2:  100% 100% 100%   respiratory rate 18 per minute, temperature 97.12F  Intake/Output Summary (Last 24 hours) at 06/11/14 1847 Last data filed at 06/11/14 1750  Gross per 24 hour  Intake 2279.74 ml  Output   1600 ml  Net 679.74 ml  Filed Weights   06/07/14 2230 06/08/14 0457 06/11/14 0004  Weight: 72.802 kg (160 lb 8 oz) 72.8 kg (160 lb 7.9 oz) 73.1 kg (161 lb 2.5 oz)     Exam:  General exam: Pleasant middle-aged female lying comfortably in bed. She was seen this morning prior to  cath. Respiratory system: Clear. No increased work of breathing. Cardiovascular system: S1 & S2 heard, RRR. No JVD, murmurs, gallops, clicks or pedal edema. Telemetry: Sinus rhythm. Gastrointestinal system: Abdomen is nondistended, soft and nontender. Normal bowel sounds heard. Central nervous system: Alert and oriented. No focal neurological deficits. Extremities: Symmetric 5 x 5 power.   Data Reviewed: Basic Metabolic Panel:  Recent Labs Lab 06/08/14 0046 06/08/14 0501 06/09/14 0255 06/10/14 0400 06/11/14 0840  NA 137 138 136* 136* 140  K 4.6 4.4 4.0 3.8 4.3  CL 100 102 103 103 108  CO2 23 24 21 20 19   GLUCOSE 369* 294* 185* 177* 187*  BUN 42* 39* 32* 22 16  CREATININE 1.53* 1.46* 1.44* 1.41* 1.20*  CALCIUM 9.0 9.0 8.6 8.4 8.3*  MG 2.3  --   --   --   --   PHOS 3.5  --   --   --   --    Liver Function Tests:  Recent Labs Lab 06/08/14 0046  AST 15  ALT 14  ALKPHOS 88  BILITOT 0.3  PROT 7.3  ALBUMIN 3.2*   No results found for this basename: LIPASE, AMYLASE,  in the last 168 hours No results found for this basename: AMMONIA,  in the last 168 hours CBC:  Recent Labs Lab 06/08/14 0046 06/09/14 0255 06/10/14 0400 06/11/14 0840  WBC 6.4 6.4 5.6 5.1  NEUTROABS 3.5  --   --   --   HGB 10.1* 8.6* 9.3* 8.8*  HCT 29.6* 25.0* 27.3* 26.3*  MCV 84.3 85.3 86.7 88.0  PLT 210 171 169 149*   Cardiac Enzymes:  Recent Labs Lab 06/08/14 0046 06/08/14 0501 06/08/14 1130  TROPONINI 0.47* 0.49* 0.34*   BNP (last 3 results) No results found for this basename: PROBNP,  in the last 8760 hours CBG:  Recent Labs Lab 06/10/14 1459 06/10/14 2152 06/11/14 0816 06/11/14 1432 06/11/14 1820  GLUCAP 119* 203* 183* 109* 80    No results found for this or any previous visit (from the past 240 hour(s)).    Additional labs: 1. Fasting lipids: Cholesterol 228, triglycerides 211, HDL 39, LDL 147 and VLDL 42 2. TSH: 1.170  3. Anemia panel: Iron 102, TIBC 283, saturation  ratios 36, ferritin 348, folate >20, B12: 955 and reticulocyte count 77.4     Studies: No results found.      Scheduled Meds: . aspirin EC  81 mg Oral Daily  . atorvastatin  80 mg Oral q1800  . atorvastatin  80 mg Oral q1800  . carvedilol  25 mg Oral BID WC  . gabapentin  100 mg Oral TID  . insulin aspart  0-9 Units Subcutaneous 6 times per day  . insulin aspart  4 Units Subcutaneous TID WC  . insulin detemir  15 Units Subcutaneous BID  . isosorbide mononitrate  60 mg Oral Daily  . pantoprazole  40 mg Oral Q1200  . prasugrel  10 mg Oral Daily  . sodium chloride  3 mL Intravenous Q12H   Continuous Infusions: . sodium chloride      Principal Problem:   NSTEMI (non-ST elevated myocardial infarction) Active Problems:   Chest pain   Benign  essential HTN   Acute renal failure   Type II or unspecified type diabetes mellitus without mention of complication, uncontrolled   HLD (hyperlipidemia)    Time spent: 20 minutes.    Marcellus Scott, MD, FACP, FHM. [facility] Pager [phone]  If 7PM-7AM, please contact night-coverage [email or web address] Password [facility] 06/11/2014, 6:47 PM    LOS: 4 days

## 2014-06-11 NOTE — CV Procedure (Signed)
Janice Love is a 68 y.o. female    [id number]  [id number] LOCATION:  FACILITY: [facility]  PHYSICIAN: Lennette Biharihomas A. Leilene Diprima, MD, [facility] [birth year]   DATE OF PROCEDURE:  06/11/2014    PERCUTANEOUS CORONARY INTERVENTION    HISTORY:    Janice Love is a 68 y.o. female who underwent diagnostic cardiac catheterization yesterday, 06/10/2014.  Has history of renal insufficiency and recent poorly controlled diabetes.  She was hydrated aggressively overnight. She has moderate concomitant coronary artery disease and now presents for her percutaneous coronary intervention to a 99% ostial RCA stenosis   PROCEDURE: Difficult but successful complex percutaneous coronary intervention to a 99% ostial RCA stenosis with PTCA, Angiosculpt scoring balloon, noncompliant balloon, dilatation, and DES stenting.  Janice Love was hydrated overnight following her diagnostic cardiac catheterization yesterday.  Creatinine today was improved at 1.2.  She was brought to the cardiac catheterization laboratory and was premedicated with Versed 2 mg and fentanyl 25 mcg.  Her right groin was prepped and shaved in usual sterile fashion. Xylocaine 1% was used for local anesthesia. A 6 French sheath was inserted into the R femoral artery.  6 [place] JR 3.5 guide with side holes was inserted and advanced to the RCA.  Selective angiography confirmed a 99% ostial stenosis.  Angiomax bolus plus infusion was administered. With her recent history of poorly controlled diabetes Effient 60 mg was administered for all antiplatelet therapy.  A Choice PT moderate support wire was then inserted after documentation of therapeutic anticoagulation and was able to cross the subtotal ostial stenosis.  An Euphora 1.5x12 mm balloon was advanced into the ostium and 3 inflations up to a maximum of 12 atmospheres were undertaken.  Multiple inflations with Angiosculpt scoring balloons 2.0x10 mm and 2.5x10 were then used to try to aggressively score this significant plaque  burden; however,  there continue to be significant recoil and stenosis.  A [place] Euphora 3.0x12 mm balloon was then inserted for aggressive noncompliant balloon dilatation.  This ultimately was successful in reducing the recoil.  A Resolute integrity 3.0x12 mm DES stent was deployed at 12 and 13 atmospheres.  Post stent dilatation was done with an [place] Trek 3.5x12 mm up to 13 atmospheres. Angiography confirmed the excellent angiographic result.  The patient left the catheterization laboratory, chest pain-free with stable hemodynamics   HEMODYNAMICS:   Central Aorta: 120/50   ANGIOGRAPHY:  The RCA  was a moderate-sized, dominant vessel with 99% ostial stenosis.  Scattered moderate distal disease was present as noted in the diagnostic catheterization report.  Following PTCA, Angiosculpt scoring Cutting Balloon, noncompliant balloon dilatation, and ultimate stenting with a 3.0x12 mm Resolute integrity stent postdilated to 3.5 mm the 99% stenosis was reduced to 0%.  There was brisk TIMI-3 flow.  There is no evidence for dissection.   IMPRESSION:  Difficult but successful PCI to a 99% ostial RCA stenosis with ultimate insertion of a 3.0 x 12 Resolute integrity DES stent post dilated to 3.5 mm and the stenosis reduced to 0%.   RECOMMENDATION:  Medical therapy for concomitant CAD with beta blocker, statin therapy, nitrates and depending upon renal function initiation of ACE/ARB therapy in light of her diabetes mellitus with plans to continue dual antiplatelet therapy with aspirin/effient and for a minimum of a year.   Lennette Biharihomas A. Carron Mcmurry, MD, [facility] 06/11/2014 7:47 PM

## 2014-06-12 ENCOUNTER — Encounter (HOSPITAL_COMMUNITY): Payer: Self-pay | Admitting: Physician Assistant

## 2014-06-12 DIAGNOSIS — I214 Non-ST elevation (NSTEMI) myocardial infarction: Secondary | ICD-10-CM

## 2014-06-12 DIAGNOSIS — N179 Acute kidney failure, unspecified: Secondary | ICD-10-CM

## 2014-06-12 DIAGNOSIS — I10 Essential (primary) hypertension: Secondary | ICD-10-CM

## 2014-06-12 DIAGNOSIS — E1165 Type 2 diabetes mellitus with hyperglycemia: Secondary | ICD-10-CM

## 2014-06-12 DIAGNOSIS — R079 Chest pain, unspecified: Secondary | ICD-10-CM

## 2014-06-12 DIAGNOSIS — E1129 Type 2 diabetes mellitus with other diabetic kidney complication: Secondary | ICD-10-CM

## 2014-06-12 DIAGNOSIS — E785 Hyperlipidemia, unspecified: Secondary | ICD-10-CM

## 2014-06-12 LAB — CBC
HCT: 23.3 % — ABNORMAL LOW (ref 36.0–46.0)
HEMOGLOBIN: 7.8 g/dL — AB (ref 12.0–15.0)
MCH: 28.8 pg (ref 26.0–34.0)
MCHC: 33.5 g/dL (ref 30.0–36.0)
MCV: 86 fL (ref 78.0–100.0)
Platelets: 144 10*3/uL — ABNORMAL LOW (ref 150–400)
RBC: 2.71 MIL/uL — ABNORMAL LOW (ref 3.87–5.11)
RDW: 13.5 % (ref 11.5–15.5)
WBC: 5.7 10*3/uL (ref 4.0–10.5)

## 2014-06-12 LAB — BASIC METABOLIC PANEL
ANION GAP: 16 — AB (ref 5–15)
BUN: 16 mg/dL (ref 6–23)
CHLORIDE: 106 meq/L (ref 96–112)
CO2: 16 mEq/L — ABNORMAL LOW (ref 19–32)
Calcium: 7.9 mg/dL — ABNORMAL LOW (ref 8.4–10.5)
Creatinine, Ser: 1.63 mg/dL — ABNORMAL HIGH (ref 0.50–1.10)
GFR calc non Af Amer: 31 mL/min — ABNORMAL LOW (ref >90)
GFR, EST AFRICAN AMERICAN: 36 mL/min — AB (ref >90)
Glucose, Bld: 244 mg/dL — ABNORMAL HIGH (ref 70–99)
Potassium: 4.1 mEq/L (ref 3.7–5.3)
SODIUM: 138 meq/L (ref 137–147)

## 2014-06-12 LAB — GLUCOSE, CAPILLARY
GLUCOSE-CAPILLARY: 234 mg/dL — AB (ref 70–99)
Glucose-Capillary: 129 mg/dL — ABNORMAL HIGH (ref 70–99)
Glucose-Capillary: 166 mg/dL — ABNORMAL HIGH (ref 70–99)
Glucose-Capillary: 250 mg/dL — ABNORMAL HIGH (ref 70–99)

## 2014-06-12 LAB — PREPARE RBC (CROSSMATCH)

## 2014-06-12 LAB — ABO/RH: ABO/RH(D): A POS

## 2014-06-12 MED ORDER — INSULIN ASPART 100 UNIT/ML ~~LOC~~ SOLN
0.0000 [IU] | Freq: Three times a day (TID) | SUBCUTANEOUS | Status: DC
  Administered 2014-06-12 – 2014-06-13 (×2): 2 [IU] via SUBCUTANEOUS

## 2014-06-12 MED ORDER — INSULIN DETEMIR 100 UNIT/ML ~~LOC~~ SOLN
18.0000 [IU] | Freq: Two times a day (BID) | SUBCUTANEOUS | Status: DC
  Administered 2014-06-12 (×2): 18 [IU] via SUBCUTANEOUS
  Filled 2014-06-12 (×4): qty 0.18

## 2014-06-12 MED ORDER — SODIUM CHLORIDE 0.9 % IV SOLN
INTRAVENOUS | Status: DC
  Administered 2014-06-13: 06:00:00 via INTRAVENOUS

## 2014-06-12 MED FILL — Sodium Chloride IV Soln 0.9%: INTRAVENOUS | Qty: 50 | Status: AC

## 2014-06-12 NOTE — Progress Notes (Signed)
Inpatient Diabetes Program Recommendations  AACE/ADA: New Consensus Statement on Inpatient Glycemic Control (2013)  Target Ranges:  Prepandial:   less than 140 mg/dL      Peak postprandial:   less than 180 mg/dL (1-2 hours)      Critically ill patients:  140 - 180 mg/dL   Reason for Visit: Results for Janice Love, Janice Love (MRN [id number]) as of 06/12/2014 11:14  Ref. Range 06/11/2014 19:53 06/11/2014 21:41 06/12/2014 00:01 06/12/2014 04:15 06/12/2014 08:12  Glucose-Capillary Latest Range: 70-99 mg/dL 409136 (H) 811183 (H) 914250 (H) 234 (H) 166 (H)  Results for Janice Love, Janice Love (MRN [id number]) as of 06/12/2014 11:14  Ref. Range 06/08/2014 00:46  Hemoglobin A1C Latest Range: <5.7 % 15.2 (H)   Note referral received regarding recommendations for "outpatient insulin regimen".  Patient has been taught insulin administration.  Note Levemir increased further to 18 units bid.  Patient is also on Novolog 4 units tid with meals.  Avoidance of hypoglycemia is very important.  Will follow CBG's today and make further recommendations this afternoon.  May want to simplify regimen and order Levemir once daily at discharge?    Thanks, Beryl MeagerJenny Alden Feagan, RN, BC-ADM Inpatient Diabetes Coordinator Pager [phone]

## 2014-06-12 NOTE — Progress Notes (Signed)
I am concerned about the dropping hemoglobin. Therefore she is not ready for discharge. Creat is also heading up. We need to trend both and manage appropriately. IV hydration as tolerated. Continue ASA and EFFIENT for the time being.

## 2014-06-12 NOTE — Progress Notes (Signed)
NUTRITION FOLLOW-UP  INTERVENTION: Education provided.   NUTRITION DIAGNOSIS: Inadequate oral intake related to omission of energy dense foods as evidenced by pt NPO; resolved.   Knowledge deficit related to lack of prior education as evidenced by new dx of DM  Lab Results  Component Value Date   HGBA1C 15.2* 06/08/2014    Monitor:  PO intake, blood sugars   ASSESSMENT: Pt admitted with chest pain. Pt with recent dx of Type 2 diabetes. Elevated glucose on admission.    Pt reports that she was losing weight PTA for the last 2 weeks due taste changes and N/V.  Per pt since admission she has tolerated her diet well and is eating 100% of her meals.   RD provided "Carbohydrate Counting for People with Diabetes" handout from the Academy of Nutrition and Dietetics. Discussed different food groups and their effects on blood sugar, emphasizing carbohydrate-containing foods. Provided list of carbohydrates and recommended serving sizes of common foods.  Discussed importance of controlled and consistent carbohydrate intake throughout the day. Provided examples of ways to balance meals/snacks and encouraged intake of high-fiber, whole grain complex carbohydrates. Teach back method used.  Expect good compliance.  Body mass index is 28.79 kg/(m^2). Pt meets criteria for overweight based on current BMI.  Height: Ht Readings from Last 1 Encounters:  06/07/14 5\' 3"  (1.6 m)    Weight: Wt Readings from Last 1 Encounters:  06/12/14 162 lb 7.7 oz (73.7 kg)    BMI:  Body mass index is 28.79 kg/(m^2).  Estimated Nutritional Needs: Kcal: 1500-1700 Protein: 87-95g Fluid: ~2.0 L/day  Skin: intact  Diet Order: Carb Control Meal Completion: 100%  EDUCATION NEEDS: -Education needs addressed   Intake/Output Summary (Last 24 hours) at 06/12/14 1628 Last data filed at 06/12/14 1500  Gross per 24 hour  Intake 2516.67 ml  Output   1700 ml  Net 816.67 ml    Last BM:  7/27  Labs:   Recent Labs Lab 06/08/14 0046  06/10/14 0400 06/11/14 0840 06/12/14 0348  NA 137  < > 136* 140 138  K 4.6  < > 3.8 4.3 4.1  CL 100  < > 103 108 106  CO2 23  < > 20 19 16*  BUN 42*  < > 22 16 16   CREATININE 1.53*  < > 1.41* 1.20* 1.63*  CALCIUM 9.0  < > 8.4 8.3* 7.9*  MG 2.3  --   --   --   --   PHOS 3.5  --   --   --   --   GLUCOSE 369*  < > 177* 187* 244*  < > = values in this interval not displayed.  CBG (last 3)   Recent Labs  06/12/14 0001 06/12/14 0415 06/12/14 0812  GLUCAP 250* 234* 166*    Scheduled Meds: . aspirin EC  81 mg Oral Daily  . atorvastatin  80 mg Oral q1800  . carvedilol  25 mg Oral BID WC  . gabapentin  100 mg Oral TID  . insulin aspart  0-9 Units Subcutaneous 6 times per day  . insulin aspart  4 Units Subcutaneous TID WC  . insulin detemir  18 Units Subcutaneous BID  . isosorbide mononitrate  60 mg Oral Daily  . pantoprazole  40 mg Oral Q1200  . prasugrel  10 mg Oral Daily  . sodium chloride  3 mL Intravenous Q12H    Continuous Infusions: . sodium chloride 100 mL/hr at 06/12/14 1030   Kendell BaneHeather Janea Schwenn RD, LDN,  CNSC [phone] Pager [phone] After Hours Pager

## 2014-06-12 NOTE — Progress Notes (Signed)
Patient ID: Janice Love  female  MVH:[id number]    DOB: 06-16-1946    DOA: 06/07/2014  PCP: No primary provider on file.  Assessment/Plan: Principal Problem:   NSTEMI (non-ST elevated myocardial infarction) - Peak troponin of 0.49. EKG with T wave inversion in lateral leads - Cardiology was consulted and patient underwent nuclear stress test which showed reversible ischemia at the apex, normal EF - Patient underwent cardiac cath on 7/27 which showed 95% RCA stenosis, underwent difficult but successful PCI to 99% ostial RCA, also recommended medical therapy for CAD with beta blocker, statin, nitrites - Creatinine worsened to 1.63, monitor closely, placed on gentle hydration, repeat BMET in a.m.  Active Problems: Anemia - Will obtain fecal occult blood test, transfuse 1 unit packed RBC to keep hemoglobin above 8    Benign essential HTN - Somewhat uncontrolled, unable to start ACE inhibitor secondary to renal failure, continue Coreg, Imdur    Acute renal failure on CK D. stage III - Continue gentle hydration, blood transfusion, hopefully creatinine will improve    Type II or unspecified type diabetes mellitus without mention of complication, uncontrolled -Uncontrolled, increase Levemir, continue meal coverage, sliding scale - Diabetic would need to consult to discuss with the patient and outpatient regimen    HLD (hyperlipidemia) - Continue statin   DVT Prophylaxis:  Code Status:  Family Communication:  Disposition:Possibly tomorrow if Cr improves  and patient is medically stable  Consultants:  Cardiology  Procedures:  Cardiac cath  Antibiotics:  None    Subjective: Patient seen and examined, no specific complaints  Objective: Weight change: 0.6 kg (1 lb 5.2 oz)  Intake/Output Summary (Last 24 hours) at 06/12/14 1326 Last data filed at 06/12/14 0900  Gross per 24 hour  Intake   1925 ml  Output   1700 ml  Net    225 ml   Blood pressure 144/57, pulse 58,  temperature 97.7 F (36.5 C), temperature source Oral, resp. rate 20, height 5\' 3"  (1.6 m), weight 73.7 kg (162 lb 7.7 oz), SpO2 100.00%.  Physical Exam: General: Alert and awake, oriented x3, not in any acute distress. HEENT: anicteric sclera, PERLA, EOMI CVS: S1-S2 clear, no murmur rubs or gallops Chest: clear to auscultation bilaterally, no wheezing, rales or rhonchi Abdomen: soft nontender, nondistended, normal bowel sounds  Extremities: no cyanosis, clubbing or edema noted bilaterally Neuro: Cranial nerves II-XII intact, no focal neurological deficits  Lab Results: Basic Metabolic Panel:  Recent Labs Lab 06/08/14 0046  06/11/14 0840 06/12/14 0348  NA 137  < > 140 138  K 4.6  < > 4.3 4.1  CL 100  < > 108 106  CO2 23  < > 19 16*  GLUCOSE 369*  < > 187* 244*  BUN 42*  < > 16 16  CREATININE 1.53*  < > 1.20* 1.63*  CALCIUM 9.0  < > 8.3* 7.9*  MG 2.3  --   --   --   PHOS 3.5  --   --   --   < > = values in this interval not displayed. Liver Function Tests:  Recent Labs Lab 06/08/14 0046  AST 15  ALT 14  ALKPHOS 88  BILITOT 0.3  PROT 7.3  ALBUMIN 3.2*   No results found for this basename: LIPASE, AMYLASE,  in the last 168 hours No results found for this basename: AMMONIA,  in the last 168 hours CBC:  Recent Labs Lab 06/08/14 0046  06/11/14 0840 06/12/14 0348  WBC  6.4  < > 5.1 5.7  NEUTROABS 3.5  --   --   --   HGB 10.1*  < > 8.8* 7.8*  HCT 29.6*  < > 26.3* 23.3*  MCV 84.3  < > 88.0 86.0  PLT 210  < > 149* 144*  < > = values in this interval not displayed. Cardiac Enzymes:  Recent Labs Lab 06/08/14 0046 06/08/14 0501 06/08/14 1130  TROPONINI 0.47* 0.49* 0.34*   BNP: No components found with this basename: POCBNP,  CBG:  Recent Labs Lab 06/11/14 1953 06/11/14 2141 06/12/14 0001 06/12/14 0415 06/12/14 0812  GLUCAP 136* 183* 250* 234* 166*     Micro Results: No results found for this or any previous visit (from the past 240  hour(s)).  Studies/Results: Nm Myocar Multi W/spect W/wall Motion / Ef  06/09/2014   CLINICAL DATA:  Chest pain, elevated troponin  EXAM: MYOCARDIAL IMAGING WITH SPECT (REST AND PHARMACOLOGIC-STRESS)  GATED LEFT VENTRICULAR WALL MOTION STUDY  LEFT VENTRICULAR EJECTION FRACTION  TECHNIQUE: Standard myocardial SPECT imaging was performed after resting intravenous injection of 10 mCi Tc-68m sestamibi. Subsequently, intravenous infusion of Lexiscan was performed under the supervision of the Cardiology staff. At peak effect of the drug, 30 mCi Tc-12m sestamibi was injected intravenously and standard myocardial SPECT imaging was performed. Quantitative gated imaging was also performed to evaluate left ventricular wall motion, and estimate left ventricular ejection fraction.  COMPARISON:  None.  FINDINGS: SPECT: Fixed defect involving the apex and inferior lateral walls compatible with scar/ prior infarct. On all three views, the apical component of the defect appears slightly larger on the stress views compared to the rest imaging suspicious for a small amount of apical peri-infarct ischemia.  Wall motion:  Inferior lateral hypokinesis.  Ejection fraction:  Calculated ejection fraction is 63%.  IMPRESSION: Findings suspicious for a small amounts of peri-infarct inducible ischemia in the anterior apex.  63% ejection fraction   Electronically Signed   By: Ruel Favors M.D.   On: 06/09/2014 12:27   Dg Chest Port 1 View  06/08/2014   CLINICAL DATA:  Chest pain. Current history of hypertension, diabetes and hyperlipidemia.  EXAM: PORTABLE CHEST - 1 VIEW  COMPARISON:  None.  FINDINGS: Cardiac silhouette upper normal in size to perhaps minimally enlarged for the AP portable technique. Thoracic aorta mildly tortuous. Hilar and mediastinal contours otherwise unremarkable. Lungs clear. Bronchovascular markings normal. Pulmonary vascularity normal. No visible pleural effusions. No pneumothorax.  IMPRESSION: Borderline to  mild cardiomegaly.  No acute cardiopulmonary disease.   Electronically Signed   By: Hulan Saas M.D.   On: 06/08/2014 08:16    Medications: Scheduled Meds: . aspirin EC  81 mg Oral Daily  . atorvastatin  80 mg Oral q1800  . atorvastatin  80 mg Oral q1800  . carvedilol  25 mg Oral BID WC  . gabapentin  100 mg Oral TID  . insulin aspart  0-9 Units Subcutaneous 6 times per day  . insulin aspart  4 Units Subcutaneous TID WC  . insulin detemir  18 Units Subcutaneous BID  . isosorbide mononitrate  60 mg Oral Daily  . pantoprazole  40 mg Oral Q1200  . prasugrel  10 mg Oral Daily  . sodium chloride  3 mL Intravenous Q12H      LOS: 5 days   RAI,RIPUDEEP M.D. [facility] 06/12/2014, 1:26 PM Pager: [phone]  If 7PM-7AM, please contact night-coverage [email or web address] Password [email or web address]  **Disclaimer: This note was dictated with voice recognition software. Similar  sounding words can inadvertently be transcribed and this note may contain transcription errors which may not have been corrected upon publication of note.**

## 2014-06-12 NOTE — Progress Notes (Signed)
Spoke to patient and reviewed information regarding diabetes.  She was able to properly teach back treatment of hypoglycemia and Signs and symptoms.  [place].  Gave her contact information for Dr. Nida[place].   She was taught insulin pen yesterday, but states she does not have a preference for insulin delivery.  She just wants the cheapest.  Consider Levemir 30 units daily and Novolog meal coverage 5 units tid with meals at discharge.  She has meter and strips at home.  She will need follow-up with-in 10 days of discharge with MD for insulin adjustment.  Will follow. Thanks, Beryl MeagerJenny Michaele Amundson, RN, BC-ADM Inpatient Diabetes Coordinator Pager [phone]

## 2014-06-12 NOTE — Progress Notes (Signed)
Patient Name: Janice Love Date of Encounter: 06/12/2014  Principal Problem:   NSTEMI (non-ST elevated myocardial infarction) Active Problems:   Benign essential HTN   Acute renal failure   Type II or unspecified type diabetes mellitus without mention of complication, uncontrolled   HLD (hyperlipidemia)    Patient Profile: 68 yo female w/ no previous CAD admitted from [facility] by IM  07/24 w/ chest pain, NSTEMI, CBG > 600 anemia, ARF. Cath 07/28 w/ Difficult but successful PCI to a 99% ostial RCA stenosis with ultimate insertion of a 3.0 x 12 Resolute integrity DES stent post dilated to 3.5 mm, stenosis reduced to 0%. EF 60%   SUBJECTIVE: No chest pain, no SOB, feels much better than on admission.  OBJECTIVE Filed Vitals:   06/11/14 2200 06/11/14 2354 06/12/14 0100 06/12/14 0415  BP: 128/42 136/34  144/57  Pulse: 55 62  58  Temp:  98 F (36.7 C)  97.7 F (36.5 C)  TempSrc:  Oral  Oral  Resp:  17  20  Height:      Weight:   162 lb 7.7 oz (73.7 kg)   SpO2: 97% 100%  100%    Intake/Output Summary (Last 24 hours) at 06/12/14 4098 Last data filed at 06/12/14 0700  Gross per 24 hour  Intake   1685 ml  Output   1100 ml  Net    585 ml   Filed Weights   06/08/14 0457 06/11/14 0004 06/12/14 0100  Weight: 160 lb 7.9 oz (72.8 kg) 161 lb 2.5 oz (73.1 kg) 162 lb 7.7 oz (73.7 kg)    PHYSICAL EXAM General: Well developed, well nourished, female in no acute distress. Head: Normocephalic, atraumatic.  Neck: Supple without bruits, JVD. Lungs:  Resp regular and unlabored, CTA. Heart: RRR, S1, S2, no S3, S4, or murmur; no rub. Abdomen: Soft, non-tender, non-distended, BS + x 4.  Extremities: No clubbing, cyanosis, edema.  Neuro: Alert and oriented X 3. Moves all extremities spontaneously. Psych: Normal affect.  LABS: CBC:  Recent Labs  06/11/14 0840 06/12/14 0348  WBC 5.1 5.7  HGB 8.8* 7.8*  HCT 26.3* 23.3*  MCV 88.0 86.0  PLT 149* 144*   INR:  Recent Labs   06/11/14 0515  INR 1.09   Basic Metabolic Panel:  Recent Labs  11/91/47 0840 06/12/14 0348  NA 140 138  K 4.3 4.1  CL 108 106  CO2 19 16*  GLUCOSE 187* 244*  BUN 16 16  CREATININE 1.20* 1.63*  CALCIUM 8.3* 7.9*   Anemia Panel:  Recent Labs  06/09/14 1241  VITAMINB12 955*  FOLATE >20.0  FERRITIN 348*  TIBC 283  IRON 102  RETICCTPCT 2.0    TELE:  SR, sinus brady     ECG:   Cath: 06/10/2014 The left main coronary artery was essentially a common ostium, which immediately bifurcated into the LAD and left circumflex coronary artery.  The LAD was a moderate size vessel. There was 70% to snaring the midportion a sharp bend in the diagonal vessel. The LAD after the septal perforating artery proximally had 40% stenosis that was followed by 50% stenosis. In the mid LAD there were segmental narrowings of 50%. The vessel extended to the LV apex.  The left circumflex coronary artery immediately arose from the apparent common ostium. There was narrowing of 50% ostially. This did not significantly improve following IC nitroglycerin administration. After a small left atrial circumflex branch, there was 50-60% narrowing of the bend in the  vessel. There was 70% narrowing and a sharp bend in a tortuous OM1 vessel obtuse marginal branch.  The RCA was somewhat of a superior takeoff. He was diffuse ostial 95% stenosis. There was mild catheter dampening. This did not improve with IC nitroglycerin administration x2. It was also 20% mid RCA stenosis. There was 50% ostial narrowing in the PDA-1 vessel, 70-80% at the ostium of the PDA -2 vessel and ended in a small posterolateral system  Left ventriculography revealed hyperdynamic LV function. Ejection fraction was 65-70%. He was almost near mid cavity obliteration. There was evidence of LVH. There was 2+ MR. 07/28   Difficult but successful PCI to a 99% ostial RCA stenosis with ultimate insertion of a 3.0 x 12 Resolute integrity DES stent post  dilated to 3.5 mm, stenosis reduced to 0%.   Current Medications:  . aspirin EC  81 mg Oral Daily  . atorvastatin  80 mg Oral q1800  . atorvastatin  80 mg Oral q1800  . carvedilol  25 mg Oral BID WC  . gabapentin  100 mg Oral TID  . insulin aspart  0-9 Units Subcutaneous 6 times per day  . insulin aspart  4 Units Subcutaneous TID WC  . insulin detemir  15 Units Subcutaneous BID  . isosorbide mononitrate  60 mg Oral Daily  . pantoprazole  40 mg Oral Q1200  . prasugrel  10 mg Oral Daily  . sodium chloride  3 mL Intravenous Q12H   . sodium chloride 75 mL/hr (06/12/14 0209)    ASSESSMENT AND PLAN: Principal Problem:   NSTEMI (non-ST elevated myocardial infarction) -  See cath report above. Difficult but successful PCI to a 99% ostial RCA stenosis with ultimate insertion of a 3.0 x 12 Resolute integrity DES stent post dilated to 3.5 mm, stenosis reduced to 0%. On ASA, BB, statin, Imdur 60 mg, Effient. [facility]  Active Problems:   Benign essential HTN - per IM    Acute renal failure - on IVF per IM, watch I/O    Type II or unspecified type diabetes mellitus without mention of complication, uncontrolled - per IM    HLD (hyperlipidemia) - Continue statin  Plan - f/u appt made, further eval per IM. RBCs trending down, renal function abnl, being hydrated.  Melida QuitterSigned, Wanita Derenzo , PA-C 9:07 AM 06/12/2014

## 2014-06-12 NOTE — Progress Notes (Signed)
Utilization Review Completed Chaz Mcglasson J. Michaelene Dutan, RN, BSN, NCM [phone]  

## 2014-06-13 DIAGNOSIS — Z9861 Coronary angioplasty status: Secondary | ICD-10-CM

## 2014-06-13 LAB — BASIC METABOLIC PANEL
ANION GAP: 14 (ref 5–15)
BUN: 15 mg/dL (ref 6–23)
CALCIUM: 8.5 mg/dL (ref 8.4–10.5)
CO2: 19 meq/L (ref 19–32)
Chloride: 111 mEq/L (ref 96–112)
Creatinine, Ser: 1.33 mg/dL — ABNORMAL HIGH (ref 0.50–1.10)
GFR calc Af Amer: 46 mL/min — ABNORMAL LOW (ref >90)
GFR calc non Af Amer: 40 mL/min — ABNORMAL LOW (ref >90)
GLUCOSE: 80 mg/dL (ref 70–99)
Potassium: 4.2 mEq/L (ref 3.7–5.3)
Sodium: 144 mEq/L (ref 137–147)

## 2014-06-13 LAB — CBC
HEMATOCRIT: 31 % — AB (ref 36.0–46.0)
Hemoglobin: 10.5 g/dL — ABNORMAL LOW (ref 12.0–15.0)
MCH: 29.5 pg (ref 26.0–34.0)
MCHC: 33.9 g/dL (ref 30.0–36.0)
MCV: 87.1 fL (ref 78.0–100.0)
Platelets: 180 10*3/uL (ref 150–400)
RBC: 3.56 MIL/uL — ABNORMAL LOW (ref 3.87–5.11)
RDW: 13.8 % (ref 11.5–15.5)
WBC: 6.4 10*3/uL (ref 4.0–10.5)

## 2014-06-13 LAB — TYPE AND SCREEN
ABO/RH(D): A POS
ANTIBODY SCREEN: NEGATIVE
UNIT DIVISION: 0

## 2014-06-13 LAB — GLUCOSE, CAPILLARY
GLUCOSE-CAPILLARY: 152 mg/dL — AB (ref 70–99)
Glucose-Capillary: 94 mg/dL (ref 70–99)
Glucose-Capillary: 189 mg/dL — ABNORMAL HIGH (ref 70–99)

## 2014-06-13 MED ORDER — CARVEDILOL 25 MG PO TABS: 25.0000 mg | ORAL_TABLET | Freq: Two times a day (BID) | ORAL | Status: AC

## 2014-06-13 MED ORDER — INSULIN NPH ISOPHANE & REGULAR (70-30) 100 UNIT/ML ~~LOC~~ SUSP: 15.0000 [IU] | Freq: Two times a day (BID) | SUBCUTANEOUS | Status: AC

## 2014-06-13 MED ORDER — ATORVASTATIN CALCIUM 80 MG PO TABS: 80.0000 mg | ORAL_TABLET | Freq: Every day | ORAL | Status: AC

## 2014-06-13 MED ORDER — HYDRALAZINE HCL 25 MG PO TABS
25.0000 mg | ORAL_TABLET | Freq: Two times a day (BID) | ORAL | Status: DC
  Administered 2014-06-13: 25 mg via ORAL
  Filled 2014-06-13 (×2): qty 1

## 2014-06-13 MED ORDER — PRASUGREL HCL 10 MG PO TABS: 10.0000 mg | ORAL_TABLET | Freq: Every day | ORAL | Status: AC

## 2014-06-13 MED ORDER — AMLODIPINE BESYLATE 10 MG PO TABS
10.0000 mg | ORAL_TABLET | Freq: Every day | ORAL | Status: DC
  Administered 2014-06-13: 10 mg via ORAL
  Filled 2014-06-13: qty 1

## 2014-06-13 MED ORDER — INSULIN DETEMIR 100 UNIT/ML ~~LOC~~ SOLN: 30.0000 [IU] | Freq: Every day | SUBCUTANEOUS | Status: DC

## 2014-06-13 MED ORDER — PRASUGREL HCL 10 MG PO TABS: 10.0000 mg | ORAL_TABLET | Freq: Every day | ORAL | Status: DC

## 2014-06-13 MED ORDER — HYDRALAZINE HCL 25 MG PO TABS: 25.0000 mg | ORAL_TABLET | Freq: Two times a day (BID) | ORAL | Status: DC

## 2014-06-13 MED ORDER — GABAPENTIN 100 MG PO CAPS: 100.0000 mg | ORAL_CAPSULE | Freq: Three times a day (TID) | ORAL | Status: AC

## 2014-06-13 MED ORDER — INSULIN ASPART PROT & ASPART (70-30 MIX) 100 UNIT/ML ~~LOC~~ SUSP
15.0000 [IU] | Freq: Two times a day (BID) | SUBCUTANEOUS | Status: DC
  Filled 2014-06-13: qty 10

## 2014-06-13 MED ORDER — INSULIN ASPART 100 UNIT/ML ~~LOC~~ SOLN: 5.0000 [IU] | Freq: Three times a day (TID) | SUBCUTANEOUS | Status: DC

## 2014-06-13 MED ORDER — ISOSORBIDE MONONITRATE ER 60 MG PO TB24: 60.0000 mg | ORAL_TABLET | Freq: Every day | ORAL | Status: AC

## 2014-06-13 MED ORDER — ASPIRIN 81 MG PO TBEC: 81.0000 mg | DELAYED_RELEASE_TABLET | Freq: Every day | ORAL | Status: AC

## 2014-06-13 MED ORDER — INSULIN ASPART PROT & ASPART (70-30 MIX) 100 UNIT/ML ~~LOC~~ SUSP: 15.0000 [IU] | Freq: Two times a day (BID) | SUBCUTANEOUS | Status: DC

## 2014-06-13 MED ORDER — INSULIN SYRINGES (DISPOSABLE) U-100 0.5 ML MISC: Status: AC

## 2014-06-13 NOTE — Progress Notes (Signed)
Patient Name: Janice Love Date of Encounter: 06/13/2014  Principal Problem:   NSTEMI (non-ST elevated myocardial infarction) Active Problems:   Benign essential HTN   Acute renal failure   Type II or unspecified type diabetes mellitus without mention of complication, uncontrolled   HLD (hyperlipidemia)    Patient Profile: 68 yo female w/ no previous CAD admitted from [facility] by IM 07/24 w/ chest pain, NSTEMI, CBG > 600 anemia, ARF. Cath 07/28 w/ Difficult but successful PCI to a 99% ostial RCA stenosis with ultimate insertion of a 3.0 x 12 Resolute integrity DES stent post dilated to 3.5 mm, stenosis reduced to 0%. EF 60%. H&H down, Cr up after cath. Improved w/ TX and IVF.   SUBJECTIVE: Feels great this am.  OBJECTIVE Filed Vitals:   06/12/14 1715 06/12/14 1735 06/13/14 0550 06/13/14 0554  BP: 148/70 156/68 194/72 196/74  Pulse: 61 64 66   Temp: 97.8 F (36.6 C) 97.7 F (36.5 C) 98.4 F (36.9 C)   TempSrc: Oral Oral Oral   Resp: 18 16 18    Height:   5\' 3"  (1.6 m)   Weight:   180 lb 3.2 oz (81.738 kg)   SpO2: 100% 100% 100% 100%    Intake/Output Summary (Last 24 hours) at 06/13/14 1034 Last data filed at 06/13/14 0841  Gross per 24 hour  Intake 1386.67 ml  Output      0 ml  Net 1386.67 ml   Filed Weights   06/11/14 0004 06/12/14 0100 06/13/14 0550  Weight: 161 lb 2.5 oz (73.1 kg) 162 lb 7.7 oz (73.7 kg) 180 lb 3.2 oz (81.738 kg)    PHYSICAL EXAM General: Well developed, well nourished, female in no acute distress. Head: Normocephalic, atraumatic.  Neck: Supple without bruits, JVD not elevated. Lungs:  Resp regular and unlabored, CTA. Heart: RRR, S1, S2, no S3, S4, soft murmur; no rub. Abdomen: Soft, non-tender, non-distended, BS + x 4.  Extremities: No clubbing, cyanosis, no edema. Cath site right radial OK. Neuro: Alert and oriented X 3. Moves all extremities spontaneously. Psych: Normal affect.  LABS: CBC: Recent Labs  06/12/14 0348 06/13/14 0635    WBC 5.7 6.4  HGB 7.8* 10.5*  HCT 23.3* 31.0*  MCV 86.0 87.1  PLT 144* 180   INR: Recent Labs  06/11/14 0515  INR 1.09   Basic Metabolic Panel: Recent Labs  06/12/14 0348 06/13/14 0635  NA 138 144  K 4.1 4.2  CL 106 111  CO2 16* 19  GLUCOSE 244* 80  BUN 16 15  CREATININE 1.63* 1.33*  CALCIUM 7.9* 8.5   TELE:   SR, mild sinus brady      Current Medications:  . amLODipine  10 mg Oral Daily  . aspirin EC  81 mg Oral Daily  . atorvastatin  80 mg Oral q1800  . carvedilol  25 mg Oral BID WC  . gabapentin  100 mg Oral TID  . insulin aspart  0-9 Units Subcutaneous TID WC & HS  . insulin aspart  4 Units Subcutaneous TID WC  . [START ON 06/14/2014] insulin detemir  30 Units Subcutaneous Daily  . isosorbide mononitrate  60 mg Oral Daily  . pantoprazole  40 mg Oral Q1200  . prasugrel  10 mg Oral Daily  . sodium chloride  3 mL Intravenous Q12H      ASSESSMENT AND PLAN: Principal Problem:  NSTEMI (non-ST elevated myocardial infarction) - See cath report above. Difficult but successful PCI to a  99% ostial RCA stenosis with ultimate insertion of a 3.0 x 12 Resolute integrity DES stent post dilated to 3.5 mm, stenosis reduced to 0%. On ASA, BB, statin, Imdur 60 mg, Effient. [place]  Active Problems:  Benign essential HTN - per IM   Acute renal failure - on IVF per IM, watch I/O. Cr improved   Type II or unspecified type diabetes mellitus without mention of complication, uncontrolled - per IM   HLD (hyperlipidemia) - Continue statin   Anemia - Iron was OK, s/p transfusion 2 U PRBCs, per IM  Plan - f/u appt made, otherwise per IM. RBCs and renal function improved. No further cardiac workup.   Signed, Theodore Demarkhonda Barrett , PA-C 10:34 AM 06/13/2014   I have examined the patient and reviewed assessment and plan and discussed with patient.  Agree with above as stated.  Labs better. Wrist stable.  Use Effient now.  Will try to get Effient from the  company.  If she does not qualify, plan to use generic plavix long term.  Dorotea Hand S.

## 2014-06-13 NOTE — Progress Notes (Signed)
Inpatient Diabetes Program Recommendations  AACE/ADA: New Consensus Statement on Inpatient Glycemic Control (2013)  Target Ranges:  Prepandial:   less than 140 mg/dL      Peak postprandial:   less than 180 mg/dL (1-2 hours)      Critically ill patients:  140 - 180 mg/dL   Note, Just confirmed with case manager that patient does not have Rx. Coverage through her Medicare. Called and discussed with Dr. Isidoro Donningai.  Will change patient to 70/30 regimen.  Note that at discharge, patient will need Rx. For Novolin 70/30 15 units bid-Reli-on brand from [facility].  Will need follow-up with MD with in 1 week. Called and discussed with RN and Levemir 30 units and Novolog 4 units tid with meals discontinued per verbal order.   Will discuss with patient.   Thanks, Beryl MeagerJenny Moe Graca, RN, BC-ADM Inpatient Diabetes Coordinator Pager [phone]

## 2014-06-13 NOTE — Progress Notes (Signed)
CARDIAC REHAB PHASE I   PRE:  Rate/Rhythm: 68 SR  BP:  Supine:   Sitting: 140/70  Standing:    SaO2:   MODE:  Ambulation: 460 ft   POST:  Rate/Rhythm: 78 SR  BP:  Supine:   Sitting: 162/80  Standing:    SaO2: 98%RA 0940-1036 Pt walked 460 ft on RA with steady gait. Tolerated well. No CP. MI education completed with pt who voiced understanding. Discussed stent/effient, NTG use/angina, carb counting, exercise and MI restrictions. Discussed CRP 2 and pt gave permission to refer to [facility]. Has effient booklet. Pt stated she has watched videos on diabetes and MI. Pt stated she feels better than she has in months.    Luetta Nuttingharlene Sammie Denner, RN BSN  06/13/2014 10:31 AM

## 2014-06-13 NOTE — Progress Notes (Signed)
Pt discharged to home per MD order. Pt received and reviewed all discharge instructions and medication information including follow-up appointments and prescription information. Pt verbalized understanding. Pt alert and oriented at discharge with no complaints of pain. Pt IV and telemetry box removed prior to discharge. Pt escorted to private vehicle via wheelchair by nurse tech. Eunice Winecoff C  

## 2014-06-13 NOTE — Discharge Summary (Addendum)
Physician Discharge Summary  Patient ID: Janice Love MRN: [id number] DOB/AGE: 69-Jan-1947 69 y.o.  Admit date: 06/07/2014 Discharge date: 06/13/2014  Primary Care Physician:  No primary provider on file.  Discharge Diagnoses:   . NSTEMI (non-ST elevated myocardial infarction) . Benign essential HTN . Acute renal failure likely from contrast nephropathy, resolved  . Type II or unspecified type diabetes mellitus without mention of complication, uncontrolled . anemia possibly hemodilution . HLD (hyperlipidemia)  Consults:  Cardiology   Recommendations for Outpatient Follow-up:  Please note patient is started on insulin and she has a followup appointment with Dr. Fransico Love   Please check BMET, she will benefit from ACEI/ARB once her creatinine function has normalized.  Allergies:  No Known Allergies   Discharge Medications:   Medication List    STOP taking these medications       amoxicillin-clavulanate 500-125 MG per tablet  Commonly known as:  AUGMENTIN     losartan-hydrochlorothiazide 100-25 MG per tablet  Commonly known as:  HYZAAR     metFORMIN 500 MG 24 hr tablet  Commonly known as:  GLUCOPHAGE-XR     metoprolol tartrate 25 MG tablet  Commonly known as:  LOPRESSOR      TAKE these medications       amLODipine 10 MG tablet  Commonly known as:  NORVASC  Take 10 mg by mouth daily.     aspirin 81 MG EC tablet  Take 1 tablet (81 mg total) by mouth daily.     atorvastatin 80 MG tablet  Commonly known as:  LIPITOR  Take 1 tablet (80 mg total) by mouth at bedtime.     carvedilol 25 MG tablet  Commonly known as:  COREG  Take 1 tablet (25 mg total) by mouth 2 (two) times daily with a meal.     gabapentin 100 MG capsule  Commonly known as:  NEURONTIN  Take 1 capsule (100 mg total) by mouth 3 (three) times daily.     hydrALAZINE 25 MG tablet  Commonly known as:  APRESOLINE  Take 25 mg by mouth 2 (two) times daily.     insulin NPH-regular Human (70-30) 100  UNIT/ML injection  Commonly known as:  NOVOLIN 70/30  Inject 15 Units into the skin 2 (two) times daily with a meal. Any generic brand, Reli-ON okay     Insulin Syringes (Disposable) U-100 0.5 ML Misc  Take as directed     isosorbide mononitrate 60 MG 24 hr tablet  Commonly known as:  IMDUR  Take 1 tablet (60 mg total) by mouth daily.     omeprazole 20 MG capsule  Commonly known as:  PRILOSEC  Take 20 mg by mouth daily.     prasugrel 10 MG Tabs tablet  Commonly known as:  EFFIENT  Take 1 tablet (10 mg total) by mouth daily.         Brief H and P: For complete details please refer to admission H and P, but in brief Janice Love is a 68 y.o. female with pmh significant for DM type 2 diagnosed approx 3 months or so ago (on metformin), HTN, HLD and GERD; [facility] due to 3-4 days of generalized weakness, polyuria and chest pressure; patient had mild elevated troponin acute renal failure and hyperglycemia (600 range). Patient reported chest pressure occurs mainly with activity/exertion, radiates to the base of her neck and last approx 20-30 minutes. Patient was chest pain-free on the Crestor. She denied palpitations, SOB or diaphoresis with pain.  No EKG abnormalities appreciated at [facility]; patient transferred due to lack of cardiology coverage over the weekend.   Hospital Course:  68 year old female patient with history of recently diagnosed type II DM, hypertension, hyperlipidemia and GERD, lifelong nonsmoker, family history of MI/heart disease in brother, transferred from [facility] hospital with complaints of chest pain. She was hospitalized approximately 3 months ago at [facility] and told to have diabetes and hypertension. She was discharged on oral medications. She was reassessed a week ago with her PCP who stated that her blood pressure was still not adequately controlled. While at work on 06/07/14, she started experiencing upper mid/left-sided chest pain,  radiating to her neck, no true relationship to exertion. She left work and went home. She felt dizzy and nauseous. She also had polyuria and weakness. At the outside ED, her blood sugars are apparently in the 600 mg per DL range, acute renal failure-baseline creatinine unknown and mildly elevated troponin. She was transferred to [facility] due to lack of cardiology coverage over the weekend. Cardiology consulted. Patient ruled in for MI and is undergoing cath and possible PCI after renal failure had improved with IV hydration. Patient placed on insulin for diabetes control.    NSTEMI (non-ST elevated myocardial infarction)  - Peak troponin of 0.49. EKG with T wave inversion in lateral leads. Cardiology was consulted and patient underwent nuclear stress test which showed reversible ischemia at the apex, normal EF. 2-D echo showed EF of 65-70% with grade 1 diastolic dysfunction.  - Patient underwent cardiac cath on 7/27 which showed 95% RCA stenosis, underwent difficult but successful PCI to 99% ostial RCA, also recommended medical therapy for CAD with beta blocker, statin, nitrites  - Creatinine worsened to 1.63 a day after cardiac catheterization. Patient was placed on gentle hydration, creatinine has improved to 1.3  Anemia  Stool occult test is still pending however patient was transfused one unit packed RBC and hemoglobin has improved to 10.5 after transfusion.  Benign essential HTN  - Somewhat uncontrolled, unable to start ACE inhibitor secondary to renal insufficiency, continue Coreg, Imdur, Norvasc.  Patient will likely benefit from ACE/ARB once renal insufficiency is resolved.  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 Due to insurance issues, she was not able to afford Levemir or  NovoLog insulin  HLD (hyperlipidemia)  - Continue statin   Day of Discharge BP 154/68  Pulse 66  Temp(Src) 98.4 F (36.9 C) (Oral)  Resp 18  Ht 5\' 3"  (1.6 m)  Wt 81.738 kg (180 lb 3.2 oz)  BMI 31.93 kg/m2  SpO2 100%  Physical Exam: General: Alert and awake oriented x3 not in any acute distress. CVS: S1-S2 clear no murmur rubs or gallops Chest: clear to auscultation bilaterally, no wheezing rales or rhonchi Abdomen: soft nontender, nondistended, normal bowel sounds Extremities: no cyanosis, clubbing or edema noted bilaterally Neuro: Cranial nerves II-XII intact, no focal neurological deficits   The results of significant diagnostics from this hospitalization (including imaging, microbiology, ancillary and laboratory) are listed below for reference.    LAB RESULTS: Basic Metabolic Panel:  Recent Labs Lab 06/08/14 0046  06/12/14 0348 06/13/14 0635  NA 137  < > 138 144  K 4.6  < > 4.1 4.2  CL 100  < > 106 111  CO2 23  < >  16* 19  GLUCOSE 369*  < > 244* 80  BUN 42*  < > 16 15  CREATININE 1.53*  < > 1.63* 1.33*  CALCIUM 9.0  < > 7.9* 8.5  MG 2.3  --   --   --   PHOS 3.5  --   --   --   < > = values in this interval not displayed. Liver Function Tests:  Recent Labs Lab 06/08/14 0046  AST 15  ALT 14  ALKPHOS 88  BILITOT 0.3  PROT 7.3  ALBUMIN 3.2*   No results found for this basename: LIPASE, AMYLASE,  in the last 168 hours No results found for this basename: AMMONIA,  in the last 168 hours CBC:  Recent Labs Lab 06/08/14 0046  06/12/14 0348 06/13/14 0635  WBC 6.4  < > 5.7 6.4  NEUTROABS 3.5  --   --   --   HGB 10.1*  < > 7.8* 10.5*  HCT 29.6*  < > 23.3* 31.0*  MCV 84.3  < > 86.0 87.1  PLT 210  < > 144* 180  < > = values in this interval not displayed. Cardiac Enzymes:  Recent Labs Lab 06/08/14 0501 06/08/14 1130  TROPONINI 0.49* 0.34*   BNP: No components found with this basename: POCBNP,  CBG:  Recent Labs Lab 06/13/14 0717  06/13/14 1150  GLUCAP 94 189*    Significant Diagnostic Studies:  Dg Chest Port 1 View  06/08/2014   CLINICAL DATA:  Chest pain. Current history of hypertension, diabetes and hyperlipidemia.  EXAM: PORTABLE CHEST - 1 VIEW  COMPARISON:  None.  FINDINGS: Cardiac silhouette upper normal in size to perhaps minimally enlarged for the AP portable technique. Thoracic aorta mildly tortuous. Hilar and mediastinal contours otherwise unremarkable. Lungs clear. Bronchovascular markings normal. Pulmonary vascularity normal. No visible pleural effusions. No pneumothorax.  IMPRESSION: Borderline to mild cardiomegaly.  No acute cardiopulmonary disease.   Electronically Signed   By: Hulan Saas M.D.   On: 06/08/2014 08:16    2D ECHO: Study Conclusions  - Left ventricle: The cavity size was normal. Wall thickness was increased in a pattern of moderate LVH. There was mild focal basal hypertrophy of the septum. Systolic function was vigorous. The estimated ejection fraction was in the range of 65% to 70%. Wall motion was normal; there were no regional wall motion abnormalities. Doppler parameters are consistent with abnormal left ventricular relaxation (grade 1 diastolic dysfunction). - Mitral valve: Calcified annulus. There was mild regurgitation. - Left atrium: The atrium was moderately dilated.    Disposition and Follow-up: Discharge Instructions   Amb Referral to Cardiac Rehabilitation    Complete by:  As directed   Referring to program [place] Phase 2            DISPOSITION: Home  DIET: Carb modified    DISCHARGE FOLLOW-UP Follow-up Information   Follow up with Antoine Poche, MD On 06/19/2014. (NEW OFFICE LOCATION (same phone number): [address], [address], [place]. See Dr. Wyline Mood at 10:20 am)    Specialty:  Cardiology   Contact information:   [address] [place] [address] [phone]       Follow up with NIDA,GEBRESELASSIE, MD On 07/31/2014. (for  hospital follow-up at 1:30PM)    Specialty:  Endocrinology   Contact information:   [address] [place] [address] [phone]       Time spent on Discharge: 45 mins  Signed:   Camp Gopal  M.D. [facility] 06/13/2014, 12:21 PM Pager: [phone]   **Disclaimer: This note was dictated with voice recognition software. Similar sounding words can inadvertently be transcribed and this note may contain transcription errors which may not have been corrected upon publication of note.**

## 2014-06-19 ENCOUNTER — Ambulatory Visit: Payer: Medicare Other | Admitting: Cardiology

## 2014-06-21 ENCOUNTER — Encounter: Payer: Self-pay | Admitting: Cardiovascular Disease

## 2014-07-04 ENCOUNTER — Encounter: Payer: Medicare Other | Admitting: Cardiology

## 2014-07-04 NOTE — Progress Notes (Signed)
ERROR

## 2014-07-08 ENCOUNTER — Encounter: Payer: Medicare Other | Admitting: Cardiology

## 2014-07-08 NOTE — Progress Notes (Signed)
ERROR

## 2014-07-09 ENCOUNTER — Encounter: Payer: Self-pay | Admitting: Cardiology

## 2014-08-19 ENCOUNTER — Encounter: Payer: Self-pay | Admitting: Nutrition

## 2014-08-19 ENCOUNTER — Encounter: Payer: Medicare Other | Attending: "Endocrinology | Admitting: Nutrition

## 2014-08-19 VITALS — Ht 63.0 in | Wt 164.0 lb

## 2014-08-19 DIAGNOSIS — Z713 Dietary counseling and surveillance: Secondary | ICD-10-CM | POA: Insufficient documentation

## 2014-08-19 DIAGNOSIS — E118 Type 2 diabetes mellitus with unspecified complications: Secondary | ICD-10-CM | POA: Diagnosis present

## 2014-08-19 DIAGNOSIS — IMO0002 Reserved for concepts with insufficient information to code with codable children: Secondary | ICD-10-CM

## 2014-08-19 DIAGNOSIS — E1165 Type 2 diabetes mellitus with hyperglycemia: Secondary | ICD-10-CM

## 2014-08-19 NOTE — Progress Notes (Signed)
Diabetes Self-Management Education  Visit Type:    Appt. Start Time: 8 am Appt. End Time: 9:15  08/19/2014  Janice Love, identified by name and date of birth, is a 68 y.o. female with a diagnosis of Diabetes: Type 2.  Other people present during visit:  Family Member, her son.   ASSESSMENT  Height 5\' 3"  (1.6 m), weight 164 lb (74.39 kg). Body mass index is 29.06 kg/(m^2).  Initial Visit Information:  Are you currently following a meal plan?: Yes What type of meal plan do you follow?: The Plate Method Are you taking your medications as prescribed?: Yes Are you checking your feet?: No   How often do you need to have someone help you when you read instructions, pamphlets, or other written materials from your doctor or pharmacy?: 1 - Never   Psychosocial:   Patient Belief/Attitude about Diabetes: Motivated to manage diabetes Self-care barriers: None Self-management support: Friends;Family;Doctor's office;CDE visits Other persons present: Family Member Patient Concerns: Nutrition/Meal planning;Monitoring Special Needs: None Preferred Learning Style: Auditory;Visual;Hands on; Learning Readiness: Change in progress  Complications:   Last HgB A1C per patient/outside source: 15.2 mg/dL How often do you check your blood sugar?: 1-2 times/day Fasting Blood glucose range (mg/dL): 16-109 Postprandial Blood glucose range (mg/dL): 604-540 Number of hypoglycemic episodes per month: 4 Can you tell when your blood sugar is low?: Yes What do you do if your blood sugar is low?: Grap some mints or juice Number of hyperglycemic episodes per week: 0 Have you had a dilated eye exam in the past 12 months?: Yes Have you had a dental exam in the past 12 months?: No  Diet Intake:  Breakfast: egg, applesauce and oatmeal, coffee Snack (morning): craackers or protien bar or apple,water Lunch: roast beef sandwich, soup or pb and jelly sandwich, water Snack (afternoon): crackers pb,water   Dinner: baked chicken, string beans, potatoes and water Snack (evening): yogurt Beverage(s): water  Exercise:  Exercise: Light (walking / raking leaves) Light Exercise amount of time (min / week): 30  Individualized Plan for Diabetes Self-Management Training:  Learning Objective:  Patient will have a greater understanding of diabetes self-management.  Patient education plan per assessed needs and concerns is to attend individual sessions for 3 visits.   Education Topics Reviewed with Patient Today:  Definition of diabetes, type 1 and 2, and the diagnosis of diabetes;Factors that contribute to the development of diabetes;Explored patient's options for treatment of their diabetes Role of diet in the treatment of diabetes and the relationship between the three main macronutrients and blood glucose level Role of exercise on diabetes management, blood pressure control and cardiac health. Reviewed patients medication for diabetes, action, purpose, timing of dose and side effects.;Reviewed medication adjustment guidelines for hyperglycemia and sick days. Purpose and frequency of SMBG.;Taught/discussed recording of test results and interpretation of SMBG.;Identified appropriate SMBG and/or A1C goals.;Daily foot exams;Yearly dilated eye exam Taught treatment of hypoglycemia - the 15 rule.;Discussed and identified patients' treatment of hyperglycemia. Relationship between chronic complications and blood glucose control;Dental care;Reviewed with patient heart disease, higher risk of, and prevention;Retinopathy and reason for yearly dilated eye exams;Identified and discussed with patient  current chronic complications;Assessed and discussed foot care and prevention of foot problems Identified and addressed patients feelings and concerns about diabetes;Helped patient identify a support system for diabetes management;Worked with patient to identify barriers to care and solutions   Lifestyle issues that  need to be addressed for better diabetes care  PATIENTS GOALS/Plan (Developed by the patient):  Nutrition: Follow meal plan discussed;General guidelines for healthy choices and portions discussed Physical Activity: Exercise 1-2 times per week Medications: take my medication as prescribed Monitoring : test my blood glucose as discussed (note x per day with comment) (Testing before meals and HS per MD orders) Reducing Risk: treat hypoglycemia with 15 grams of carbs if blood glucose less than 70mg /dL;do foot checks daily;examine blood glucose patterns Health Coping: discuss diabetes with (comment)  Plan:   Patient Instructions   Living Well with Diabetes  Carb Counting and Food Label handouts  Meal Plan Card     Expected Outcomes:  Demonstrated limited interest in learning.  Expect minimal changes;Demonstrated interest in learning. Expect positive outcomes  Education material provided: Living Well with Diabetes and Carb Counting Meal Plan/My Plate  If problems or questions, patient to contact team via:  Phone [phone]  Future DSME appointment: 4-6 wks

## 2014-08-19 NOTE — Patient Instructions (Signed)
   Living Well with Diabetes  Carb Counting and Food Label handouts  Meal Plan Card

## 2014-09-19 ENCOUNTER — Encounter: Payer: Medicare Other | Attending: "Endocrinology | Admitting: Nutrition

## 2014-09-24 NOTE — Progress Notes (Signed)
Received telephone call from patient stating she has not taken her brilinta since discharge on 06/13/2014, she did not think she had to take medication.  Patient states she has followed up with her physician and she is not sure if they are aware if she is not taking her medication.  I explained the importance of the medication and instructed patient to contact her primary cardiologist.  Colman Caterarpley, Gurnoor Sloop Danielle

## 2014-10-24 ENCOUNTER — Encounter (HOSPITAL_COMMUNITY): Payer: Self-pay | Admitting: Cardiovascular Disease

## 2014-11-29 ENCOUNTER — Other Ambulatory Visit: Payer: Self-pay | Admitting: Internal Medicine

## 2014-12-30 ENCOUNTER — Other Ambulatory Visit: Payer: Self-pay | Admitting: Internal Medicine

## 2015-01-17 ENCOUNTER — Other Ambulatory Visit: Payer: Self-pay | Admitting: Internal Medicine

## 2015-01-31 ENCOUNTER — Other Ambulatory Visit: Payer: Self-pay | Admitting: Internal Medicine

## 2015-03-13 ENCOUNTER — Other Ambulatory Visit: Payer: Self-pay | Admitting: Internal Medicine
